# Patient Record
Sex: Female | Born: 1965 | Race: White | Hispanic: No | Marital: Married | Smoking: Never smoker
Health system: Southern US, Community
[De-identification: ages and names within clinical notes are randomized; demographics above are authoritative.]

## PROBLEM LIST (undated history)

## (undated) DIAGNOSIS — K589 Irritable bowel syndrome without diarrhea: Secondary | ICD-10-CM

## (undated) DIAGNOSIS — B029 Zoster without complications: Secondary | ICD-10-CM

## (undated) DIAGNOSIS — K219 Gastro-esophageal reflux disease without esophagitis: Secondary | ICD-10-CM

## (undated) DIAGNOSIS — J45909 Unspecified asthma, uncomplicated: Secondary | ICD-10-CM

## (undated) DIAGNOSIS — K5090 Crohn's disease, unspecified, without complications: Secondary | ICD-10-CM

## (undated) DIAGNOSIS — I4891 Unspecified atrial fibrillation: Secondary | ICD-10-CM

## (undated) DIAGNOSIS — J302 Other seasonal allergic rhinitis: Secondary | ICD-10-CM

## (undated) DIAGNOSIS — F419 Anxiety disorder, unspecified: Secondary | ICD-10-CM

## (undated) DIAGNOSIS — G43909 Migraine, unspecified, not intractable, without status migrainosus: Secondary | ICD-10-CM

## (undated) HISTORY — DX: Unspecified asthma, uncomplicated: J45.909

## (undated) HISTORY — DX: Unspecified atrial fibrillation: I48.91

## (undated) HISTORY — PX: BREAST EXCISIONAL BIOPSY: SUR124

## (undated) HISTORY — PX: WISDOM TOOTH EXTRACTION: SHX21

## (undated) HISTORY — DX: Irritable bowel syndrome, unspecified: K58.9

## (undated) HISTORY — DX: Migraine, unspecified, not intractable, without status migrainosus: G43.909

## (undated) HISTORY — DX: Zoster without complications: B02.9

## (undated) HISTORY — PX: FOOT SURGERY: SHX648

---

## 1993-05-03 DIAGNOSIS — B029 Zoster without complications: Secondary | ICD-10-CM

## 1993-05-03 HISTORY — DX: Zoster without complications: B02.9

## 2005-09-24 ENCOUNTER — Encounter: Admission: RE | Admit: 2005-09-24 | Discharge: 2005-09-24 | Payer: Self-pay | Admitting: Family Medicine

## 2005-11-22 ENCOUNTER — Other Ambulatory Visit: Admission: RE | Admit: 2005-11-22 | Discharge: 2005-11-22 | Payer: Self-pay | Admitting: Obstetrics & Gynecology

## 2006-05-14 ENCOUNTER — Emergency Department (HOSPITAL_COMMUNITY): Admission: EM | Admit: 2006-05-14 | Discharge: 2006-05-14 | Payer: Self-pay | Admitting: Emergency Medicine

## 2008-07-11 ENCOUNTER — Other Ambulatory Visit: Admission: RE | Admit: 2008-07-11 | Discharge: 2008-07-11 | Payer: Self-pay | Admitting: Obstetrics and Gynecology

## 2008-12-04 ENCOUNTER — Encounter: Admission: RE | Admit: 2008-12-04 | Discharge: 2008-12-04 | Payer: Self-pay | Admitting: Obstetrics & Gynecology

## 2009-09-07 ENCOUNTER — Emergency Department (HOSPITAL_COMMUNITY)
Admission: EM | Admit: 2009-09-07 | Discharge: 2009-09-07 | Payer: Self-pay | Source: Home / Self Care | Admitting: Emergency Medicine

## 2009-09-07 ENCOUNTER — Emergency Department (HOSPITAL_COMMUNITY): Admission: EM | Admit: 2009-09-07 | Discharge: 2009-09-07 | Payer: Self-pay | Admitting: Emergency Medicine

## 2009-12-08 ENCOUNTER — Encounter: Admission: RE | Admit: 2009-12-08 | Discharge: 2009-12-08 | Payer: Self-pay | Admitting: Obstetrics & Gynecology

## 2010-07-21 LAB — URINALYSIS, ROUTINE W REFLEX MICROSCOPIC
Glucose, UA: NEGATIVE mg/dL
Ketones, ur: NEGATIVE mg/dL
Leukocytes, UA: NEGATIVE
Nitrite: NEGATIVE
Protein, ur: NEGATIVE mg/dL
pH: 6.5 (ref 5.0–8.0)

## 2010-07-21 LAB — D-DIMER, QUANTITATIVE: D-Dimer, Quant: 0.34 ug/mL-FEU (ref 0.00–0.48)

## 2010-07-21 LAB — COMPREHENSIVE METABOLIC PANEL
Albumin: 3.7 g/dL (ref 3.5–5.2)
Chloride: 108 mEq/L (ref 96–112)
Creatinine, Ser: 0.57 mg/dL (ref 0.4–1.2)
Glucose, Bld: 108 mg/dL — ABNORMAL HIGH (ref 70–99)
Potassium: 3.2 mEq/L — ABNORMAL LOW (ref 3.5–5.1)

## 2010-07-21 LAB — URINE MICROSCOPIC-ADD ON

## 2010-07-21 LAB — PREGNANCY, URINE: Preg Test, Ur: NEGATIVE

## 2010-07-21 LAB — POCT CARDIAC MARKERS
CKMB, poc: 1.4 ng/mL (ref 1.0–8.0)
Troponin i, poc: <0.05 ng/mL (ref 0.00–0.09)

## 2011-02-02 ENCOUNTER — Other Ambulatory Visit: Payer: Self-pay | Admitting: Obstetrics & Gynecology

## 2011-02-02 DIAGNOSIS — Z1231 Encounter for screening mammogram for malignant neoplasm of breast: Secondary | ICD-10-CM

## 2011-02-10 ENCOUNTER — Ambulatory Visit
Admission: RE | Admit: 2011-02-10 | Discharge: 2011-02-10 | Disposition: A | Discharge status: Home / Self Care | Payer: BC Managed Care – PPO | Source: Ambulatory Visit | Attending: Obstetrics & Gynecology | Admitting: Obstetrics & Gynecology

## 2011-02-10 DIAGNOSIS — Z1231 Encounter for screening mammogram for malignant neoplasm of breast: Secondary | ICD-10-CM

## 2012-03-07 ENCOUNTER — Other Ambulatory Visit: Payer: Self-pay | Admitting: Obstetrics & Gynecology

## 2012-03-07 DIAGNOSIS — Z1231 Encounter for screening mammogram for malignant neoplasm of breast: Secondary | ICD-10-CM

## 2012-04-13 ENCOUNTER — Ambulatory Visit
Admission: RE | Admit: 2012-04-13 | Discharge: 2012-04-13 | Disposition: A | Discharge status: Home / Self Care | Payer: BC Managed Care – PPO | Source: Ambulatory Visit | Attending: Obstetrics & Gynecology | Admitting: Obstetrics & Gynecology

## 2012-04-13 DIAGNOSIS — Z1231 Encounter for screening mammogram for malignant neoplasm of breast: Secondary | ICD-10-CM

## 2012-12-18 ENCOUNTER — Encounter: Payer: Self-pay | Admitting: Gynecology

## 2012-12-18 ENCOUNTER — Ambulatory Visit (INDEPENDENT_AMBULATORY_CARE_PROVIDER_SITE_OTHER): Payer: BC Managed Care – PPO | Admitting: Gynecology

## 2012-12-18 ENCOUNTER — Ambulatory Visit: Payer: Self-pay | Admitting: Obstetrics and Gynecology

## 2012-12-18 VITALS — BP 120/72 | HR 66 | Resp 16 | Ht 63.0 in | Wt 181.0 lb

## 2012-12-18 DIAGNOSIS — Z0000 Encounter for general adult medical examination without abnormal findings: Secondary | ICD-10-CM

## 2012-12-18 DIAGNOSIS — Z01419 Encounter for gynecological examination (general) (routine) without abnormal findings: Secondary | ICD-10-CM

## 2012-12-18 DIAGNOSIS — Z124 Encounter for screening for malignant neoplasm of cervix: Secondary | ICD-10-CM

## 2012-12-18 LAB — POCT URINALYSIS DIPSTICK
Leukocytes, UA: NEGATIVE
Urobilinogen, UA: NEGATIVE
pH, UA: 5

## 2012-12-18 NOTE — Patient Instructions (Signed)

## 2012-12-18 NOTE — Progress Notes (Signed)
47 y.o. Married Caucasian female  G2P2 here for annual exam. Pt is currently sexually active.  Pt denies dyspareunia.  Cycles regular.  Irritable bowel associated with stress, pt is Runner, broadcasting/film/video.  Patient's last menstrual period was 12/03/2012.          Sexually active: yes  The current method of family planning is Husband has Vasectomy.    Exercising: no  The patient does not participate in regular exercise at present. Last pap: 11/24/2009- Negative Alcohol: 2-3 drinks/wk Tobacco: no BSE: yes Mammogram: 12/13 BiRADS 1  Urine: Blood 2   No health maintenance topics applied.  Family History  Problem Relation Age of Onset  . Osteoporosis Maternal Grandmother     There are no active problems to display for this patient.   Past Medical History  Diagnosis Date  . IBS (irritable bowel syndrome)   . Migraines     Menstrual  . Asthma     No past surgical history on file.  Allergies: Ciprofloxacin and Penicillins  Current Outpatient Prescriptions  Medication Sig Dispense Refill  . Eletriptan Hydrobromide (RELPAX PO) Take by mouth.      . Loratadine (CLARITIN PO) Take by mouth daily.      Marland Kitchen MAGNESIUM PO Take by mouth.      . Multiple Vitamins-Minerals (MULTIVITAMIN PO) Take by mouth.      . montelukast (SINGULAIR) 10 MG tablet        No current facility-administered medications for this visit.    ROS: A comprehensive review of systems was negative.  Exam:    BP 120/72  Pulse 66  Resp 16  Ht 5\' 3"  (1.6 m)  Wt 181 lb (82.101 kg)  BMI 32.07 kg/m2  LMP 12/03/2012 Weight change: @WEIGHTCHANGE @ Last 3 height recordings:  Ht Readings from Last 3 Encounters:  12/18/12 5\' 3"  (1.6 m)   General appearance: alert, cooperative and appears stated age Head: Normocephalic, without obvious abnormality, atraumatic Neck: no adenopathy, no carotid bruit, no JVD, supple, symmetrical, trachea midline and thyroid not enlarged, symmetric, no tenderness/mass/nodules Lungs: clear to  auscultation bilaterally Breasts: normal appearance, no masses or tenderness Heart: regular rate and rhythm, S1, S2 normal, no murmur, click, rub or gallop Abdomen: soft, non-tender; bowel sounds normal; no masses,  no organomegaly Extremities: extremities normal, atraumatic, no cyanosis or edema Skin: Skin color, texture, turgor normal. No rashes or lesions Lymph nodes: Cervical, supraclavicular, and axillary nodes normal. no inguinal nodes palpated Neurologic: Grossly normal   Pelvic: External genitalia:  no lesions              Urethra: not indicated              Bartholins and Skenes: Bartholin's, Urethra, Skene's normal                 Vagina: normal appearing vagina with normal color and discharge, no lesions              Cervix: normal appearance              Pap taken: yes        Bimanual Exam:  Uterus:  uterus is normal size, shape, consistency and nontender                                      Adnexa:    not indicated  Rectovaginal: Confirms                                      Anus:  normal sphincter tone, no lesions  A: well woman     P: mammogram annual pap smear with HRHPV return annually or prn   An After Visit Summary was printed and given to the patient.

## 2012-12-21 LAB — IPS PAP TEST WITH HPV

## 2013-08-13 ENCOUNTER — Other Ambulatory Visit: Payer: Self-pay

## 2013-08-13 DIAGNOSIS — Z1231 Encounter for screening mammogram for malignant neoplasm of breast: Secondary | ICD-10-CM

## 2013-08-17 ENCOUNTER — Ambulatory Visit
Admission: RE | Admit: 2013-08-17 | Discharge: 2013-08-17 | Disposition: A | Discharge status: Home / Self Care | Payer: BC Managed Care – PPO | Source: Ambulatory Visit

## 2013-08-17 DIAGNOSIS — Z1231 Encounter for screening mammogram for malignant neoplasm of breast: Secondary | ICD-10-CM

## 2014-03-04 ENCOUNTER — Encounter: Payer: Self-pay | Admitting: Gynecology

## 2014-07-25 ENCOUNTER — Telehealth: Payer: Self-pay | Admitting: Nurse Practitioner

## 2014-07-25 ENCOUNTER — Other Ambulatory Visit: Payer: Self-pay | Admitting: Obstetrics and Gynecology

## 2014-07-25 ENCOUNTER — Other Ambulatory Visit: Payer: Self-pay

## 2014-07-25 DIAGNOSIS — N6011 Diffuse cystic mastopathy of right breast: Secondary | ICD-10-CM

## 2014-07-25 DIAGNOSIS — N644 Mastodynia: Secondary | ICD-10-CM

## 2014-07-25 DIAGNOSIS — N6012 Diffuse cystic mastopathy of left breast: Secondary | ICD-10-CM

## 2014-07-25 NOTE — Telephone Encounter (Signed)
Left message to call [facility] [employer] at [phone]. 

## 2014-07-25 NOTE — Telephone Encounter (Signed)
Patient calling about "left breast pain."

## 2014-07-25 NOTE — Telephone Encounter (Signed)
Spoke with patient. Patient states that she has on and off left sided breast pain that has been going on for a couple of weeks. "I had a needle biopsy in my twenties and everything was benign. It seems like it is in the same spot." States the pain is extending under arm. Denies any change in breast size, color, or temperature. Advised patient will need to be seen for evaluation. Patient is agreeable. Appointment scheduled for 3/28 at 11:15am with Lauro FranklinPatricia Rolen-Grubb, FNP. Patient is agreeable to date and time.  Routing to provider for final review. Patient agreeable to disposition. Will close encounter

## 2014-07-29 ENCOUNTER — Encounter: Payer: Self-pay | Admitting: Nurse Practitioner

## 2014-07-29 ENCOUNTER — Ambulatory Visit (INDEPENDENT_AMBULATORY_CARE_PROVIDER_SITE_OTHER): Payer: BC Managed Care – PPO | Admitting: Nurse Practitioner

## 2014-07-29 ENCOUNTER — Other Ambulatory Visit: Payer: Self-pay | Admitting: Nurse Practitioner

## 2014-07-29 VITALS — BP 118/60 | HR 92 | Resp 18 | Ht 63.0 in | Wt 181.2 lb

## 2014-07-29 DIAGNOSIS — N644 Mastodynia: Secondary | ICD-10-CM

## 2014-07-29 DIAGNOSIS — N6012 Diffuse cystic mastopathy of left breast: Secondary | ICD-10-CM

## 2014-07-29 DIAGNOSIS — N6011 Diffuse cystic mastopathy of right breast: Secondary | ICD-10-CM

## 2014-07-29 NOTE — Progress Notes (Signed)
Appointment scheduled for [facility] for left breast diagnostic and ultrasound. Appointment scheduled for 3/30 at 9:10am. Patient is agreeable to date and time.

## 2014-07-29 NOTE — Progress Notes (Signed)
   Subjective:   49 y.o. Married white female G2 P2 .presents for evaluation of left breast pain. Onset of the symptoms was on and off for over 6 months.  Some times fullness and pain is worse at time of menses.  Patient sought evaluation because of breast tenderness.  Contributing factors include: none.   Patient denies history of trauma, bites, or injuries. Last mammogram was April 2015 normal.  Previous evaluation has included breast biopsy (age 49) with a benign breast biopsy.  Thought to be FCB.  No FMH of breast cancer.  The area of fullness or tenderness is at the area of previous biopsy left outer quadrant at 5:00 position.   Review of Systems A comprehensive review of systems was negative.   Objective:   General appearance: alert, cooperative and appears stated age Head: Normocephalic, without obvious abnormality, atraumatic Neck: no adenopathy, no JVD, supple, symmetrical, trachea midline and thyroid not enlarged, symmetric, no tenderness/mass/nodules Back: negative, symmetric, no curvature. ROM normal. No CVA tenderness. Lungs: normal percussion bilaterally Breasts: normal appearance, no masses or tenderness, positive findings: fibrocystic changes and left breast at 5:00 postion there is a scar.  No mass or lesions could be felt.  Only FCB changes. Heart: regular rate and rhythm Abdomen: normal findings: soft, non-tender    Assessment:   ASSESSMENT:Patient is diagnosed with mastalgia, likely due to fibrocystic changes   Plan:   PLAN: The patient has a documented plan to follow with further care of diagnostic Mammo 3D and US on the left or both this week. 2. PLAN: [facility] or sooner, if symptoms worsen 3.  She is also advised to adjust the size of her bra wear as this may also be causing this area of discomfort. No under wires.

## 2014-07-31 ENCOUNTER — Ambulatory Visit
Admission: RE | Admit: 2014-07-31 | Discharge: 2014-07-31 | Disposition: A | Discharge status: Home / Self Care | Payer: BC Managed Care – PPO | Source: Ambulatory Visit | Attending: Nurse Practitioner | Admitting: Nurse Practitioner

## 2014-07-31 DIAGNOSIS — N6012 Diffuse cystic mastopathy of left breast: Secondary | ICD-10-CM

## 2014-07-31 DIAGNOSIS — N6011 Diffuse cystic mastopathy of right breast: Secondary | ICD-10-CM

## 2014-07-31 DIAGNOSIS — N644 Mastodynia: Secondary | ICD-10-CM

## 2014-07-31 NOTE — Progress Notes (Signed)
Encounter reviewed by Dr. Candus Braud Silva.  

## 2014-10-23 ENCOUNTER — Ambulatory Visit (INDEPENDENT_AMBULATORY_CARE_PROVIDER_SITE_OTHER): Payer: BC Managed Care – PPO | Admitting: Nurse Practitioner

## 2014-10-23 ENCOUNTER — Encounter: Payer: Self-pay | Admitting: Nurse Practitioner

## 2014-10-23 VITALS — BP 134/80 | HR 72 | Ht 63.0 in | Wt 190.0 lb

## 2014-10-23 DIAGNOSIS — Z01419 Encounter for gynecological examination (general) (routine) without abnormal findings: Secondary | ICD-10-CM | POA: Diagnosis not present

## 2014-10-23 NOTE — Patient Instructions (Addendum)

## 2014-10-23 NOTE — Progress Notes (Signed)
49 y.o. G56P2002 Married  Caucasian Fe here for annual exam.  For this past year will occasionally skip a year.  Then in March menses was regular, then in April spotting for less than a day.  No menses in May and now a regular cycle with moderate flow.  Some vaso symptoms..  Patient's last menstrual period was 10/22/2014.          Sexually active: Yes.    The current method of family planning is Husband has had vasectomy.    Exercising: No.  The patient does not participate in regular exercise at present. Smoker:  no  Health Maintenance: Pap:  12/18/12 wnl neg HR HPV MMG:  07/31/14 simple cyst in left breast benign, due for bilateral now and will schedule Colonoscopy:  2012 benign polyps f/u due in 2017 TDaP:  12/16/12 Labs: PCP   reports that she has never smoked. She does not have any smokeless tobacco history on file. She reports that she drinks about 0.6 - 1.2 oz of alcohol per week. She reports that she does not use illicit drugs.  Past Medical History  Diagnosis Date  . IBS (irritable bowel syndrome)   . Migraines     Menstrual  . Asthma     History reviewed. No pertinent past surgical history.  Current Outpatient Prescriptions  Medication Sig Dispense Refill  . aspirin 81 MG tablet Take 81 mg by mouth daily.    . diphenoxylate-atropine (LOMOTIL) 2.5-0.025 MG per tablet Take by mouth 4 (four) times daily as needed for diarrhea or loose stools.    . Loratadine (CLARITIN PO) Take by mouth daily.    Marland Kitchen MAGNESIUM PO Take by mouth.    . montelukast (SINGULAIR) 10 MG tablet     . Multiple Vitamins-Minerals (MULTIVITAMIN PO) Take by mouth.    . Eletriptan Hydrobromide (RELPAX PO) Take by mouth as needed.      No current facility-administered medications for this visit.    Family History  Problem Relation Age of Onset  . Osteoporosis Maternal Grandmother     ROS:  Pertinent items are noted in HPI.  Otherwise, a comprehensive ROS was negative.  Exam:   BP 134/80 mmHg  Pulse  72  Ht 5\' 3"  (1.6 m)  Wt 190 lb (86.183 kg)  BMI 33.67 kg/m2  LMP 10/22/2014 Height: 5\' 3"  (160 cm) Ht Readings from Last 3 Encounters:  10/23/14 5\' 3"  (1.6 m)  07/29/14 5\' 3"  (1.6 m)  12/18/12 5\' 3"  (1.6 m)    General appearance: alert, cooperative and appears stated age Head: Normocephalic, without obvious abnormality, atraumatic Neck: no adenopathy, supple, symmetrical, trachea midline and thyroid normal to inspection and palpation Lungs: clear to auscultation bilaterally Breasts: normal appearance, no masses or tenderness Heart: regular rate and rhythm Abdomen: soft, non-tender; no masses,  no organomegaly Extremities: extremities normal, atraumatic, no cyanosis or edema Skin: Skin color, texture, turgor normal. No rashes or lesions Lymph nodes: Cervical, supraclavicular, and axillary nodes normal. No abnormal inguinal nodes palpated Neurologic: Grossly normal   Pelvic: External genitalia:  no lesions              Urethra:  normal appearing urethra with no masses, tenderness or lesions              Bartholin's and Skene's: normal                 Vagina: normal appearing vagina with normal color and discharge, no lesions  Cervix: anteverted              Pap taken: No. Bimanual Exam:  Uterus:  normal size, contour, position, consistency, mobility, non-tender              Adnexa: no mass, fullness, tenderness               Rectovaginal: Confirms               Anus:  normal sphincter tone, no lesions  Chaperone present: No  A:  Well Woman with normal exam  Husband with vasectomy  Perimenopausal C/W irregular menses  P:   Reviewed health and wellness pertinent to exam  Pap smear as above  Mammogram is due now and will schedule now that school is out  Counseled on breast self exam, mammography screening, adequate intake of calcium and vitamin D, diet and exercise return annually or prn  An After Visit Summary was printed and given to the patient.

## 2014-10-26 NOTE — Progress Notes (Signed)
Encounter reviewed by Dr. Brook Silva.  

## 2014-10-30 ENCOUNTER — Other Ambulatory Visit: Payer: Self-pay

## 2014-10-30 DIAGNOSIS — Z1231 Encounter for screening mammogram for malignant neoplasm of breast: Secondary | ICD-10-CM

## 2014-11-01 ENCOUNTER — Ambulatory Visit
Admission: RE | Admit: 2014-11-01 | Discharge: 2014-11-01 | Disposition: A | Discharge status: Home / Self Care | Payer: BC Managed Care – PPO | Source: Ambulatory Visit

## 2014-11-01 DIAGNOSIS — Z1231 Encounter for screening mammogram for malignant neoplasm of breast: Secondary | ICD-10-CM

## 2015-05-06 ENCOUNTER — Other Ambulatory Visit: Payer: Self-pay | Admitting: Family Medicine

## 2015-05-06 DIAGNOSIS — R1013 Epigastric pain: Secondary | ICD-10-CM

## 2015-05-13 ENCOUNTER — Other Ambulatory Visit: Payer: BC Managed Care – PPO

## 2015-05-19 ENCOUNTER — Ambulatory Visit
Admission: RE | Admit: 2015-05-19 | Discharge: 2015-05-19 | Disposition: A | Discharge status: Home / Self Care | Payer: BC Managed Care – PPO | Source: Ambulatory Visit | Attending: Family Medicine | Admitting: Family Medicine

## 2015-05-19 DIAGNOSIS — R1013 Epigastric pain: Secondary | ICD-10-CM

## 2015-06-05 ENCOUNTER — Telehealth: Payer: Self-pay | Admitting: Obstetrics and Gynecology

## 2015-06-05 NOTE — Telephone Encounter (Signed)
Patient says she hasn't had her cycle since 03/06/2016 and was told to let Dr. Edward Jolly know. 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

## 2015-06-05 NOTE — Telephone Encounter (Signed)
Spoke with patient. She reports she has not had a menses since November of 2016. Reports since May 2016 she was having her cycles every 2 months until November. She did have light spotting 2 weeks ago. "It was just a little spotting. I thought I was going to have my cycle, but it never came." Advised I will speak with Ria Comment, FNP regarding her cycles and further recommendations and return call. She is agreeable.

## 2015-06-05 NOTE — Telephone Encounter (Signed)
The patient was last seen in office for her aex on 10/23/2014 with Ria Comment, FNP. At that time she had been experiencing irregular cycles. She had a cycle in March 2016, spotting in April 2016, no menses in May 2016, and normal cycle in June 2016. The patient is calling to report she has not had a cycle since 03/07/2015.  Left message to call Aram Domzalski at [phone]. How was her menses from June 2016-November 2016?

## 2015-06-06 NOTE — Telephone Encounter (Signed)
Left message to call Jermanie Minshall at [phone]. 

## 2015-06-06 NOTE — Telephone Encounter (Signed)
We consider any spotting even light, as a cycle.  She now needs to keep a record and if no further bleeding in 3 months to call back.

## 2015-06-09 NOTE — Telephone Encounter (Signed)
Patient left message on our voicemail during lunch hours returning your call. [phone]

## 2015-06-09 NOTE — Telephone Encounter (Signed)
Left message to call Danasia Baker at [phone]. 

## 2015-06-10 NOTE — Telephone Encounter (Signed)
Spoke with patient. Advised of message as seen below form Ria Comment, FNP. She is agreeable and verbalizes understanding.  Routing to provider for final review. Patient agreeable to disposition. Will close encounter.

## 2015-07-09 ENCOUNTER — Telehealth: Payer: Self-pay | Admitting: Obstetrics and Gynecology

## 2015-07-09 NOTE — Telephone Encounter (Signed)
Spoke with patient. Patient states she had light bleeding in January. Prior to that LMP was in Nov 2016. Reports she started bleeding 3 weeks ago. Bleeding was constant for 3 weeks and intermittently heavy with clotting. Bleeding stopped for 24 hours then began again today. Reports she is passing increased clots with intermittent heavy bleeding. Has been fatigued over the last month. Denies SOB, dizziness, or light headedness. Advised she will need to be seen in the office for further evaluation. She is agreeable. 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 Appointment scheduled for 07/11/2015 at 11:15 am with Ria CommentPatricia Grubb, FNP. She is agreeable to date and time. Advised if bleeding increases to having to change pad/tampon every 1 for 2 hours she will need to be seen sooner for evaluation. She is agreeable.  Routing to provider for final review. Patient agreeable to disposition. Will close encounter.

## 2015-07-09 NOTE — Telephone Encounter (Signed)
Patient's cycle has been going on for a month now wants to talk with a nurse. Best # to reach: [phone]

## 2015-07-11 ENCOUNTER — Ambulatory Visit (INDEPENDENT_AMBULATORY_CARE_PROVIDER_SITE_OTHER): Payer: BC Managed Care – PPO | Admitting: Nurse Practitioner

## 2015-07-11 ENCOUNTER — Encounter: Payer: Self-pay | Admitting: Nurse Practitioner

## 2015-07-11 VITALS — BP 120/78 | HR 72 | Ht 63.0 in | Wt 185.0 lb

## 2015-07-11 DIAGNOSIS — N939 Abnormal uterine and vaginal bleeding, unspecified: Secondary | ICD-10-CM

## 2015-07-11 LAB — TSH: TSH: 1.29 mIU/L

## 2015-07-11 MED ORDER — MEDROXYPROGESTERONE ACETATE 10 MG PO TABS: 10.0000 mg | ORAL_TABLET | Freq: Every day | ORAL | Status: DC

## 2015-07-11 NOTE — Progress Notes (Signed)
Subjective:     Patient ID: Nicole Hays, female   DOB: Sep 19, 1965, [age] y.o.   MRN: [id number]  HPI  This 50 yo WM female G2P2 here for evaluation of AUB.  LMP 06/13/15 till present.  PMP was in January with light bleeding for 2 days that occurred after SA.Marland Kitchen.  Previous to that one was November 2016 normal flow lasted 5-6 days.  Some days are heavier with moderate size clotting.  No cramps. Some fatigue, dizzy, and lethargy.  No palpitations.     Review of Systems  Constitutional: Positive for fatigue.  Respiratory: Negative.   Cardiovascular: Negative for palpitations.  Gastrointestinal: Negative.   Genitourinary: Positive for vaginal bleeding and menstrual problem.  Musculoskeletal: Negative.   Neurological: Positive for dizziness.  Hematological: Negative.   Psychiatric/Behavioral: Negative.        Objective:   Physical Exam  Constitutional: She is oriented to person, place, and time. She appears well-developed and well-nourished.  Cardiovascular: Normal rate.   Pulmonary/Chest: Effort normal.  Abdominal: Soft. She exhibits no distension and no mass. There is no tenderness. There is no rebound and no guarding.  Genitourinary:  Moderate vaginal bleeding, no evidence of a polyp.  No enlargement of the uterus.  No adnexal mass.  Musculoskeletal: Normal range of motion.  Neurological: She is alert and oriented to person, place, and time.  Psychiatric: She has a normal mood and affect. Her behavior is normal. Judgment and thought content normal.       Assessment:    AUB Perimenopausal     Plan:     Will have her to take Provera 10 mg X 10 days - initially to stop this cycle and expect a withdrawal bleed.  Then should resume to a normal menses.  She is aware that if she has amenorrhea for 3 months to call or if AUB again.  Will then do further evaluation.

## 2015-07-11 NOTE — Patient Instructions (Addendum)

## 2015-07-12 LAB — CBC WITH DIFFERENTIAL/PLATELET
Basophils Absolute: 0 10*3/uL (ref 0.0–0.1)
Basophils Relative: 0 % (ref 0–1)
EOS PCT: 1 % (ref 0–5)
Eosinophils Absolute: 0.1 10*3/uL (ref 0.0–0.7)
HEMATOCRIT: 37.5 % (ref 36.0–46.0)
Hemoglobin: 12.3 g/dL (ref 12.0–15.0)
LYMPHS PCT: 24 % (ref 12–46)
Lymphs Abs: 2.8 10*3/uL (ref 0.7–4.0)
MCH: 28 pg (ref 26.0–34.0)
MCHC: 32.8 g/dL (ref 30.0–36.0)
MCV: 85.4 fL (ref 78.0–100.0)
MONO ABS: 0.6 10*3/uL (ref 0.1–1.0)
MONOS PCT: 5 % (ref 3–12)
MPV: 12.2 fL (ref 8.6–12.4)
Neutro Abs: 8.3 10*3/uL — ABNORMAL HIGH (ref 1.7–7.7)
Neutrophils Relative %: 70 % (ref 43–77)
Platelets: 319 10*3/uL (ref 150–400)
RBC: 4.39 MIL/uL (ref 3.87–5.11)
RDW: 14.2 % (ref 11.5–15.5)
WBC: 11.8 10*3/uL — ABNORMAL HIGH (ref 4.0–10.5)

## 2015-07-13 NOTE — Progress Notes (Signed)
Encounter reviewed by Dr. Janean SarkBrook Amundson C. Silva. BC method - vasectomy on chart review.

## 2015-07-15 ENCOUNTER — Telehealth: Payer: Self-pay | Admitting: *Deleted

## 2015-07-15 DIAGNOSIS — N939 Abnormal uterine and vaginal bleeding, unspecified: Secondary | ICD-10-CM

## 2015-07-15 NOTE — Telephone Encounter (Signed)
-----   Message from Ria CommentPatricia Grubb, FNP sent at 07/14/2015  8:17 AM EDT ----- Please let pt know that CBC shows a normal HGB at 12.3 but her WBC is slightly elevated - has she been sick with URI?  The TSH is normal.

## 2015-07-15 NOTE — Telephone Encounter (Signed)
I have attempted to contact this patient by phone with the following results: left message to return call to [place] at [phone] answering machine (home/mobile [facility]).  No personal information given.  [phone] (Home) *Preferred*

## 2015-07-24 MED ORDER — NORETHINDRONE ACETATE 5 MG PO TABS: 5.0000 mg | ORAL_TABLET | Freq: Two times a day (BID) | ORAL | Status: DC

## 2015-07-24 NOTE — Telephone Encounter (Signed)
Routing to Dr.Miller for review and advise. 

## 2015-07-24 NOTE — Telephone Encounter (Signed)
Spoke with patient. Advised of message as seen below from Dr.Miller. She is agreeable and verbalizes understanding. Rx for Aygestin 5 mg bid #30 0RF sent to pharmacy on file. Aware she may need an EMB tomorrow depending on evaluation with Dr.Miller and her recommendations. Advised to take 800 mg of Ibuprofen/Motrin 1 hour prior to the appointment in case EMB is needed. She is agreeable and verbalizes understanding. Future order placed for CBC.  Routing to provider for final review. Patient agreeable to disposition. Will close encounter.

## 2015-07-24 NOTE — Telephone Encounter (Signed)
Spoke with patient. Advised of message and results as seen below from Ria CommentPatricia Grubb, FNP. Reports she has not been ill recently. "I have actually been very health." Advised I will let Ria CommentPatricia Grubb, FNP know. Advised it is not uncommon for the WBC to fluctuate in patients. Advised she will need a follow up lab to recheck CBC. Advised I will speak with the provider about recommended time frame. She is agreeable. States she started Provera on 07/11/2015. LMP was from 06/13/2015-07/11/2015. States with taking Provera her cycle stopped for 4 days and then restarted on 07/19/2015. Bleeding is heavy. States she is having to change an overnight pad every hour to hour and a half due to the pad being "full". Denies any feeling of fatigue, dizziness, or light headedness. Advised withdrawal bleed that is heavier than normal is expected after completing the Provera. Due to the amount of bleeding she is experiencing I will speak with the covering provider regarding follow up. She is agreeable.

## 2015-07-24 NOTE — Telephone Encounter (Signed)
Change to Aygestin 5mg  bid.  I think she needs repeat appt and possibly endometrial biopsy.  This sounds like a lot of bleeding.  We can repeat the CBC when she is here.

## 2015-07-25 ENCOUNTER — Encounter: Payer: Self-pay | Admitting: Obstetrics & Gynecology

## 2015-07-25 ENCOUNTER — Ambulatory Visit (INDEPENDENT_AMBULATORY_CARE_PROVIDER_SITE_OTHER): Payer: BC Managed Care – PPO | Admitting: Obstetrics & Gynecology

## 2015-07-25 VITALS — BP 120/64 | HR 78 | Resp 16 | Ht 63.0 in | Wt 186.0 lb

## 2015-07-25 DIAGNOSIS — Z124 Encounter for screening for malignant neoplasm of cervix: Secondary | ICD-10-CM

## 2015-07-25 DIAGNOSIS — N921 Excessive and frequent menstruation with irregular cycle: Secondary | ICD-10-CM | POA: Diagnosis not present

## 2015-07-25 DIAGNOSIS — D500 Iron deficiency anemia secondary to blood loss (chronic): Secondary | ICD-10-CM | POA: Diagnosis not present

## 2015-07-25 LAB — POCT URINE PREGNANCY: PREG TEST UR: NEGATIVE

## 2015-07-25 LAB — FOLLICLE STIMULATING HORMONE: FSH: 5.2 m[IU]/mL

## 2015-07-25 NOTE — Patient Instructions (Signed)
Increase the norethindrone 5mg  to two tablet (10mg ) twice daily.  Continue this until you have no bleeding for 48 hours.  Then decrease back to 5mg  twice daily for two more days.  Then decrease to 5mg  daily.  If you start bleeding, go back to what you were taking when you stopped bleeding.

## 2015-07-25 NOTE — Progress Notes (Signed)
GYNECOLOGY  VISIT   HPI: 50 y.o. G20P2002 Married Caucasian female here with menorrhagia.  Pt has been bleeding for about five weeks except for a few days when she was on Provera.  She reports her cycles have definitely changed in the past year.  She skips on to three months of bleeding at a time but, then the subsequent bleeding is quite heavy and can last longer then a typical period.  This cycle started 06/13/15.  She saw Shirlyn Goltz on 07/11/15 and was started on Provera.  She reports the bleeding stopped for 4 days with the provera but started back before the provera was completed.  Then when it was done, the bleeding got even heavier.  Yesterday, she was filling up a pad every 90 minutes.  She was started on aygestin  bid yesterday and she reports her bleeding improved significantly overnight and today, except for a two hour period where she had heavy bleeding that soaked a pad and leaked around in the front and back.  However, it is better now.  Denies light headedness, SOB, palpitations, weakness.  She just wants the bleeding to stop at this point.  Last pap 8/14: neg with neg HR HPV.  She's had no evaluation for this bleeding, to date.  GYNECOLOGIC HISTORY: Patient's last menstrual period was 07/15/2015. Contraception: spouse with vasectomy  There are no active problems to display for this patient.   Past Medical History  Diagnosis Date  . IBS (irritable bowel syndrome)   . Migraines     Menstrual  . Asthma     History reviewed. No pertinent past surgical history.  MEDS:  Reviewed in EPIC and UTD  ALLERGIES: Ciprofloxacin and Penicillins  Family History  Problem Relation Age of Onset  . Osteoporosis Maternal Grandmother     SH: married, non smoker  Review of Systems  Genitourinary:       Heavy bleeding  All other systems reviewed and are negative.   PHYSICAL EXAMINATION:    BP 120/64 mmHg  Pulse 78  Resp 16  Ht  (1.6 m)  Wt 186 lb (84.369 kg)  BMI 32.96  kg/m2  LMP 07/15/2015    General appearance: alert, cooperative and appears stated age Neck: no adenopathy, supple, symmetrical, trachea midline and thyroid normal to inspection and palpation CV:  Regular rate and rhythm Lungs:  clear to auscultation, no wheezes, rales or rhonchi, symmetric air entry Abdomen: soft, non-tender; bowel sounds normal; no masses,  no organomegaly  Pelvic: External genitalia:  no lesions              Urethra:  normal appearing urethra with no masses, tenderness or lesions              Bartholins and Skenes: normal                 Vagina: normal appearing vagina with normal color and discharge, no lesions, small amount of blood present at os but not significant              Cervix: no lesions    Pap was obtained              Bimanual Exam:  Uterus:  normal size, contour, position, consistency, mobility, non-tender              Adnexa: no mass, fullness, tenderness              Anus:  normal sphincter tone, no lesions  Endometrial biopsy  recommended.  Discussed with patient.  Verbal and written consent obtained.   Procedure:  Speculum placed.  Cervix visualized and cleansed with betadine prep.  A single toothed tenaculum was applied to the anterior lip of the cervix.  Endometrial pipelle was advanced through the cervix into the endometrial cavity without difficulty.  Pipelle passed to 7cm.  Suction applied and pipelle removed with good tissue sample obtained.  Tenculum removed.  No bleeding noted.  Patient tolerated procedure well.  Chaperone was present for exam.  Assessment: Menorrhagia with cycle lasting almost daily cine 06/13/15 Perimenopausal Mild anemia with hb 11.4 today  Plan: Increase norithindrone to 10mg  bid until no bleeding for 48 hours.  Then decrease to 5mg  BID for another 48 hours.  Then decrease to 5mg  daily. [facility] Endometrial biopsy pending.  Results and recommendations will be called to pt.

## 2015-07-25 NOTE — Progress Notes (Signed)
Scheduled patient while in the office for PUS on 07/29/2015 at 4 pm with consult at 4:30 pm with Dr.Miller. She is agreeable to date and time.

## 2015-07-27 DIAGNOSIS — N921 Excessive and frequent menstruation with irregular cycle: Secondary | ICD-10-CM | POA: Insufficient documentation

## 2015-07-27 DIAGNOSIS — D500 Iron deficiency anemia secondary to blood loss (chronic): Secondary | ICD-10-CM | POA: Insufficient documentation

## 2015-07-28 ENCOUNTER — Telehealth: Payer: Self-pay | Admitting: Obstetrics & Gynecology

## 2015-07-28 LAB — HEMOGLOBIN, FINGERSTICK: HEMOGLOBIN, FINGERSTICK: 11.4 g/dL — AB (ref 12.0–16.0)

## 2015-07-28 NOTE — Telephone Encounter (Signed)
Called patient to review benefits for a recommended procedure. Left Voicemail requesting a call back. °

## 2015-07-28 NOTE — Telephone Encounter (Signed)
Gave patient benefit for ultrasound, patient agreeable, encounter closed.

## 2015-07-29 ENCOUNTER — Ambulatory Visit (INDEPENDENT_AMBULATORY_CARE_PROVIDER_SITE_OTHER): Payer: BC Managed Care – PPO

## 2015-07-29 ENCOUNTER — Ambulatory Visit (INDEPENDENT_AMBULATORY_CARE_PROVIDER_SITE_OTHER): Payer: BC Managed Care – PPO | Admitting: Obstetrics & Gynecology

## 2015-07-29 VITALS — BP 132/70 | HR 76 | Resp 16 | Ht 63.0 in | Wt 185.4 lb

## 2015-07-29 DIAGNOSIS — N840 Polyp of corpus uteri: Secondary | ICD-10-CM | POA: Diagnosis not present

## 2015-07-29 DIAGNOSIS — N921 Excessive and frequent menstruation with irregular cycle: Secondary | ICD-10-CM

## 2015-07-29 DIAGNOSIS — D500 Iron deficiency anemia secondary to blood loss (chronic): Secondary | ICD-10-CM | POA: Diagnosis not present

## 2015-07-29 MED ORDER — DIAZEPAM 5 MG PO TABS: 5.0000 mg | ORAL_TABLET | Freq: Four times a day (QID) | ORAL | Status: DC | PRN

## 2015-07-29 MED ORDER — OXYCODONE-ACETAMINOPHEN 5-325 MG PO TABS: 2.0000 | ORAL_TABLET | ORAL | Status: DC | PRN

## 2015-07-29 MED ORDER — NORETHINDRONE ACETATE 5 MG PO TABS: 5.0000 mg | ORAL_TABLET | Freq: Two times a day (BID) | ORAL | Status: DC

## 2015-07-29 NOTE — Progress Notes (Signed)
GYNECOLOGY  VISIT   HPI: 50 y.o. [id number] Married Caucasian female here for additional evaluation of bleeding that has been ongoing since mid January.  The Aygestin did significantly decrease her bleeding.  She lowered the dosage earlier this week but started spotting again.  It is not significant.  FSH was low and endometrial biopsy was negative but did show an endometrial polyp.    Pt is here for PUS and for additional recommendations.  She denies dizziness, SOB, palpitations or fatigue.  She is just glad the bleeding is improved.  GYNECOLOGIC HISTORY: Patient's last menstrual period was 07/15/2015. Contraception: vasectomy Last Pap:  07/28/14, normal.  Patient Active Problem List   Diagnosis Date Noted  . Menorrhagia with irregular cycle 07/27/2015  . Anemia due to chronic blood loss 07/27/2015    Past Medical History  Diagnosis Date  . IBS (irritable bowel syndrome)   . Migraines     Menstrual  . Asthma     No past surgical history on file.  MEDS:  Reviewed in EPIC and UTD  ALLERGIES: Ciprofloxacin and Penicillins  Family History  Problem Relation Age of Onset  . Osteoporosis Maternal Grandmother     SH: married, non smoker  Review of Systems  All other systems reviewed and are negative.   PHYSICAL EXAMINATION:    BP 132/70 mmHg  Pulse 76  Resp 16  Ht 5\' 3"  (1.6 m)  Wt 185 lb 6.4 oz (84.097 kg)  BMI 32.85 kg/m2  LMP 07/15/2015    General appearance: alert, cooperative and appears stated age Neck: no adenopathy, supple, symmetrical, trachea midline and thyroid normal to inspection and palpation CV:  Regular rate and rhythm Lungs:  clear to auscultation, no wheezes, rales or rhonchi, symmetric air entry Abdomen: soft, non-tender; bowel sounds normal; no masses,  no organomegaly  Pelvic: Repeat pelvic exam was not performed.  PUS results: Uterus: 8.7 x 5.6 x 5.1cm Endometrium: 6mm with probable polyp noted on 3D imaging.  Endometrium is irregular. Left  ovary 2.8 x 1.5 x 1.5cm Right ovary 3.0 x 2.0 x 2.2cm with 1.8cm follicle Cul de sac:  Discussion:  Considering the amount of bleeding she's been having and the endometrial thickness/irregularity as well as the polyp feel treatment is warranted.  Continued oral progesterone, oral OCPs, polyp resection and progesterone IUD placement, polyp resection and endometrial ablation all reviewed.  Side effects, risks of each option reviewed.  Pt would like most definitive treatment.  She is not interested in hysterectomy.  Procedure discussed with patient.  Recovery and pain management discussed.  Risks discussed including but not limited to bleeding, rare risk of transfusion, infection, 1% risk of uterine perforation with risks of fluid deficit causing cardiac arrythmia, cerebral swelling and/or need to stop procedure early.  Fluid emboli and rare risk of death discussed.  DVT/PE, rare risk of risk of bowel/bladder/ureteral/vascular injury.  Also, importance of birth control discussed.  Risks of post operative ablation syndrome reviewed as well.  Patient aware if pathology with polyp removal is abnormal she may need additional treatment.  All questions answered.  She wishes to consider dates today and sat with Billie RuddySally Yeakley, RN, before leaving office.  Assessment: Menorrhagia with irregular cycle Endometrial polyp Irregular endometrium Anemia  Plan: Hysteroscopy with polyp resection, D&C, followed by endometrial ablation planned. Pt knows to stop ASA products five days before procedure.  Orders placed.   Rx for Valium and Percocet given with instructions for use post operatively.   ~30 minutes spent with  patient >50% of time was in face to face discussion of above.

## 2015-07-31 ENCOUNTER — Telehealth: Payer: Self-pay | Admitting: Obstetrics & Gynecology

## 2015-07-31 ENCOUNTER — Other Ambulatory Visit: Payer: Self-pay | Admitting: Obstetrics & Gynecology

## 2015-07-31 ENCOUNTER — Encounter: Payer: Self-pay | Admitting: Obstetrics & Gynecology

## 2015-07-31 DIAGNOSIS — N840 Polyp of corpus uteri: Secondary | ICD-10-CM | POA: Insufficient documentation

## 2015-07-31 LAB — IPS PAP TEST WITH REFLEX TO HPV

## 2015-07-31 NOTE — Telephone Encounter (Signed)
Patient is scheduled for surgery 08/11/15 with Dr. Hyacinth MeekerMiller but has not received a call in regards to what her insurance will cover/patietn responsibilites. Best # to reach: [phone]

## 2015-07-31 NOTE — Telephone Encounter (Signed)
Call patient to discuss benefits for a procedure. Left Voicemail requesting a call. °

## 2015-07-31 NOTE — Telephone Encounter (Signed)
Patient is returning a call to Suzy. °

## 2015-08-01 ENCOUNTER — Other Ambulatory Visit: Payer: Self-pay | Admitting: *Deleted

## 2015-08-01 ENCOUNTER — Encounter (HOSPITAL_COMMUNITY): Payer: Self-pay | Admitting: *Deleted

## 2015-08-01 NOTE — Telephone Encounter (Signed)
Nicole Hays from [facility] calling requesting a new prescription of Aygestin be sent in. Patient said the directions have increased for patient to take it four times daily, and insurance won't pay for it. 07/29/15 #30/0 rfs was sent to [facility] with patient to take bid. Please advise new rx with dosage and directions if this is correct so patient can get rx, she is almost out of pills.

## 2015-08-02 MED ORDER — NORETHINDRONE ACETATE 5 MG PO TABS: 10.0000 mg | ORAL_TABLET | Freq: Two times a day (BID) | ORAL | Status: DC

## 2015-08-04 NOTE — Telephone Encounter (Signed)
Returned call to patient to convey benefits. Left message on voicemail requesting a call back

## 2015-08-05 NOTE — Telephone Encounter (Signed)
Spoke with pt regarding benefit for surgery. Patient understood and agreeable. Patient has already been scheduled on 08/11/15. Patient advised she will call back on 08/08/15 to provide surgery deposit. Patient is aware this is professional benefit only. Patient aware will be contacted by hospital for separate benefits.

## 2015-08-08 ENCOUNTER — Telehealth: Payer: Self-pay | Admitting: *Deleted

## 2015-08-08 NOTE — Telephone Encounter (Signed)
Call to patient. Advised of recommendations from Dr Hyacinth MeekerMiller for pain medication through the weekend. Patient has had this for a while without it bothering her and planned to take care of it next week on spring break. Advised to notify anesthesia at hospital on Monday. Encounter closed.

## 2015-08-08 NOTE — Telephone Encounter (Signed)
Patient returns calls. Reviewed surgery instructions for procedure on Monday. Patient has been off her baby ASA for 6 days.  Has a cracked tooth on top right that has not ben an issue but started hurting today and Motrin is the only thing that helps with pain. Patient is asking what to take this weekend for pain? Can she take the prescription given for post op pain? Advised will review with Dr Hyacinth MeekerMiller and call her back.

## 2015-08-08 NOTE — Telephone Encounter (Signed)
Call to patient, left message to call back. 

## 2015-08-08 NOTE — Telephone Encounter (Signed)
Yes she can take the post op rx for pain.

## 2015-08-11 ENCOUNTER — Encounter (HOSPITAL_COMMUNITY)
Admission: RE | Disposition: A | Discharge status: Home / Self Care | Payer: Self-pay | Source: Ambulatory Visit | Attending: Obstetrics & Gynecology

## 2015-08-11 ENCOUNTER — Ambulatory Visit (HOSPITAL_COMMUNITY)
Admission: RE | Admit: 2015-08-11 | Discharge: 2015-08-11 | Disposition: A | Discharge status: Home / Self Care | Payer: BC Managed Care – PPO | Source: Ambulatory Visit | Attending: Obstetrics & Gynecology | Admitting: Obstetrics & Gynecology

## 2015-08-11 ENCOUNTER — Ambulatory Visit (HOSPITAL_COMMUNITY): Payer: BC Managed Care – PPO | Admitting: Certified Registered Nurse Anesthetist

## 2015-08-11 ENCOUNTER — Encounter (HOSPITAL_COMMUNITY): Payer: Self-pay | Admitting: Emergency Medicine

## 2015-08-11 DIAGNOSIS — N920 Excessive and frequent menstruation with regular cycle: Secondary | ICD-10-CM | POA: Insufficient documentation

## 2015-08-11 DIAGNOSIS — Z7982 Long term (current) use of aspirin: Secondary | ICD-10-CM | POA: Diagnosis not present

## 2015-08-11 DIAGNOSIS — K219 Gastro-esophageal reflux disease without esophagitis: Secondary | ICD-10-CM | POA: Insufficient documentation

## 2015-08-11 DIAGNOSIS — N921 Excessive and frequent menstruation with irregular cycle: Secondary | ICD-10-CM | POA: Diagnosis not present

## 2015-08-11 DIAGNOSIS — K589 Irritable bowel syndrome without diarrhea: Secondary | ICD-10-CM | POA: Insufficient documentation

## 2015-08-11 DIAGNOSIS — Z881 Allergy status to other antibiotic agents status: Secondary | ICD-10-CM | POA: Insufficient documentation

## 2015-08-11 DIAGNOSIS — D649 Anemia, unspecified: Secondary | ICD-10-CM | POA: Diagnosis not present

## 2015-08-11 DIAGNOSIS — Z880 Allergy status to penicillin: Secondary | ICD-10-CM | POA: Insufficient documentation

## 2015-08-11 DIAGNOSIS — Z79899 Other long term (current) drug therapy: Secondary | ICD-10-CM | POA: Insufficient documentation

## 2015-08-11 DIAGNOSIS — J45909 Unspecified asthma, uncomplicated: Secondary | ICD-10-CM | POA: Diagnosis not present

## 2015-08-11 DIAGNOSIS — N840 Polyp of corpus uteri: Secondary | ICD-10-CM | POA: Insufficient documentation

## 2015-08-11 HISTORY — DX: Anxiety disorder, unspecified: F41.9

## 2015-08-11 HISTORY — PX: DILATATION & CURETTAGE/HYSTEROSCOPY WITH MYOSURE: SHX6511

## 2015-08-11 HISTORY — PX: NOVASURE ABLATION: SHX5394

## 2015-08-11 HISTORY — DX: Other seasonal allergic rhinitis: J30.2

## 2015-08-11 HISTORY — DX: Gastro-esophageal reflux disease without esophagitis: K21.9

## 2015-08-11 LAB — CBC
HEMATOCRIT: 34.7 % — AB (ref 36.0–46.0)
Hemoglobin: 11.8 g/dL — ABNORMAL LOW (ref 12.0–15.0)
MCH: 28.6 pg (ref 26.0–34.0)
MCHC: 34 g/dL (ref 30.0–36.0)
MCV: 84 fL (ref 78.0–100.0)
Platelets: 378 10*3/uL (ref 150–400)
RBC: 4.13 MIL/uL (ref 3.87–5.11)
RDW: 14.9 % (ref 11.5–15.5)
WBC: 9.4 10*3/uL (ref 4.0–10.5)

## 2015-08-11 LAB — PREGNANCY, URINE: Preg Test, Ur: NEGATIVE

## 2015-08-11 SURGERY — DILATATION & CURETTAGE/HYSTEROSCOPY WITH MYOSURE
Anesthesia: General | Site: Vagina

## 2015-08-11 MED ORDER — KETOROLAC TROMETHAMINE 30 MG/ML IJ SOLN
INTRAMUSCULAR | Status: AC
  Filled 2015-08-11: qty 1

## 2015-08-11 MED ORDER — LIDOCAINE-EPINEPHRINE 1 %-1:100000 IJ SOLN
INTRAMUSCULAR | Status: DC | PRN
  Administered 2015-08-11: 10 mL

## 2015-08-11 MED ORDER — LIDOCAINE-EPINEPHRINE 1 %-1:100000 IJ SOLN
INTRAMUSCULAR | Status: AC
  Filled 2015-08-11: qty 1

## 2015-08-11 MED ORDER — IBUPROFEN 100 MG/5ML PO SUSP
200.0000 mg | Freq: Four times a day (QID) | ORAL | Status: DC | PRN
  Filled 2015-08-11: qty 20

## 2015-08-11 MED ORDER — HYDROCODONE-ACETAMINOPHEN 7.5-325 MG PO TABS: 1.0000 | ORAL_TABLET | Freq: Once | ORAL | Status: DC | PRN

## 2015-08-11 MED ORDER — PROPOFOL 10 MG/ML IV BOLUS
INTRAVENOUS | Status: AC
  Filled 2015-08-11: qty 20

## 2015-08-11 MED ORDER — SCOPOLAMINE 1 MG/3DAYS TD PT72
MEDICATED_PATCH | TRANSDERMAL | Status: AC
  Administered 2015-08-11: 1.5 mg via TRANSDERMAL
  Filled 2015-08-11: qty 1

## 2015-08-11 MED ORDER — KETOROLAC TROMETHAMINE 30 MG/ML IJ SOLN: 30.0000 mg | Freq: Once | INTRAMUSCULAR | Status: DC

## 2015-08-11 MED ORDER — LIDOCAINE HCL (CARDIAC) 20 MG/ML IV SOLN
INTRAVENOUS | Status: AC
  Filled 2015-08-11: qty 5

## 2015-08-11 MED ORDER — SCOPOLAMINE 1 MG/3DAYS TD PT72
1.0000 | MEDICATED_PATCH | Freq: Once | TRANSDERMAL | Status: DC
  Administered 2015-08-11: 1.5 mg via TRANSDERMAL

## 2015-08-11 MED ORDER — KETOROLAC TROMETHAMINE 30 MG/ML IJ SOLN
INTRAMUSCULAR | Status: DC | PRN
  Administered 2015-08-11: 30 mg via INTRAVENOUS

## 2015-08-11 MED ORDER — DEXAMETHASONE SODIUM PHOSPHATE 10 MG/ML IJ SOLN
INTRAMUSCULAR | Status: DC | PRN
  Administered 2015-08-11: 4 mg via INTRAVENOUS

## 2015-08-11 MED ORDER — FENTANYL CITRATE (PF) 100 MCG/2ML IJ SOLN
INTRAMUSCULAR | Status: AC
  Filled 2015-08-11: qty 2

## 2015-08-11 MED ORDER — LIDOCAINE HCL (CARDIAC) 20 MG/ML IV SOLN
INTRAVENOUS | Status: DC | PRN
  Administered 2015-08-11: 80 mg via INTRAVENOUS

## 2015-08-11 MED ORDER — SILVER NITRATE-POT NITRATE 75-25 % EX MISC
CUTANEOUS | Status: AC
  Filled 2015-08-11: qty 1

## 2015-08-11 MED ORDER — IBUPROFEN 200 MG PO TABS
200.0000 mg | ORAL_TABLET | Freq: Four times a day (QID) | ORAL | Status: DC | PRN
  Filled 2015-08-11: qty 2

## 2015-08-11 MED ORDER — MEPERIDINE HCL 25 MG/ML IJ SOLN: 6.2500 mg | INTRAMUSCULAR | Status: DC | PRN

## 2015-08-11 MED ORDER — LACTATED RINGERS IV SOLN
INTRAVENOUS | Status: DC
  Administered 2015-08-11 (×2): via INTRAVENOUS

## 2015-08-11 MED ORDER — MIDAZOLAM HCL 2 MG/2ML IJ SOLN
INTRAMUSCULAR | Status: DC | PRN
  Administered 2015-08-11: 2 mg via INTRAVENOUS

## 2015-08-11 MED ORDER — ONDANSETRON HCL 4 MG/2ML IJ SOLN
INTRAMUSCULAR | Status: AC
  Filled 2015-08-11: qty 2

## 2015-08-11 MED ORDER — PROPOFOL 10 MG/ML IV BOLUS
INTRAVENOUS | Status: DC | PRN
Start: 2015-08-11 — End: 2015-08-11
  Administered 2015-08-11: 200 mg via INTRAVENOUS

## 2015-08-11 MED ORDER — DEXAMETHASONE SODIUM PHOSPHATE 4 MG/ML IJ SOLN
INTRAMUSCULAR | Status: AC
  Filled 2015-08-11: qty 1

## 2015-08-11 MED ORDER — ONDANSETRON HCL 4 MG/2ML IJ SOLN: 4.0000 mg | Freq: Once | INTRAMUSCULAR | Status: DC | PRN

## 2015-08-11 MED ORDER — FENTANYL CITRATE (PF) 100 MCG/2ML IJ SOLN: 25.0000 ug | INTRAMUSCULAR | Status: DC | PRN

## 2015-08-11 MED ORDER — ONDANSETRON HCL 4 MG/2ML IJ SOLN
INTRAMUSCULAR | Status: DC | PRN
  Administered 2015-08-11: 4 mg via INTRAVENOUS

## 2015-08-11 MED ORDER — FENTANYL CITRATE (PF) 100 MCG/2ML IJ SOLN
INTRAMUSCULAR | Status: DC | PRN
  Administered 2015-08-11 (×2): 50 ug via INTRAVENOUS

## 2015-08-11 MED ORDER — MIDAZOLAM HCL 2 MG/2ML IJ SOLN
INTRAMUSCULAR | Status: AC
  Filled 2015-08-11: qty 2

## 2015-08-11 SURGICAL SUPPLY — 22 items
ABLATOR ENDOMETRIAL BIPOLAR (ABLATOR) ×2 IMPLANT
CANISTER SUCT 3000ML (MISCELLANEOUS) ×2 IMPLANT
CATH ROBINSON RED A/P 16FR (CATHETERS) ×2 IMPLANT
CLOTH BEACON ORANGE TIMEOUT ST (SAFETY) ×2 IMPLANT
CONTAINER PREFILL 10% NBF 60ML (FORM) ×4 IMPLANT
DEVICE MYOSURE LITE (MISCELLANEOUS) ×2 IMPLANT
DEVICE MYOSURE REACH (MISCELLANEOUS) IMPLANT
DILATOR CANAL MILEX (MISCELLANEOUS) IMPLANT
ELECT REM PT RETURN 9FT ADLT (ELECTROSURGICAL)
ELECTRODE REM PT RTRN 9FT ADLT (ELECTROSURGICAL) IMPLANT
FILTER ARTHROSCOPY CONVERTOR (FILTER) ×2 IMPLANT
GLOVE BIOGEL PI IND STRL 7.0 (GLOVE) ×2 IMPLANT
GLOVE BIOGEL PI INDICATOR 7.0 (GLOVE) ×2
GLOVE ECLIPSE 6.5 STRL STRAW (GLOVE) ×4 IMPLANT
GOWN STRL REUS W/TWL LRG LVL3 (GOWN DISPOSABLE) ×4 IMPLANT
PACK VAGINAL MINOR WOMEN LF (CUSTOM PROCEDURE TRAY) ×2 IMPLANT
PAD OB MATERNITY 4.3X12.25 (PERSONAL CARE ITEMS) ×2 IMPLANT
SEAL ROD LENS SCOPE MYOSURE (ABLATOR) ×2 IMPLANT
TOWEL OR 17X24 6PK STRL BLUE (TOWEL DISPOSABLE) ×4 IMPLANT
TUBING AQUILEX INFLOW (TUBING) ×2 IMPLANT
TUBING AQUILEX OUTFLOW (TUBING) ×2 IMPLANT
WATER STERILE IRR 1000ML POUR (IV SOLUTION) ×2 IMPLANT

## 2015-08-11 NOTE — Discharge Instructions (Addendum)
Post-surgical Instructions, Outpatient Surgery  You may expect to feel dizzy, weak, and drowsy for as long as 24 hours after receiving the medicine that made you sleep (anesthetic). For the first 24 hours after your surgery:    Do not drive a car, ride a bicycle, participate in physical activities, or take public transportation until you are done taking narcotic pain medicines or as directed by Dr. Hyacinth MeekerMiller.   Do not drink alcohol or take tranquilizers.   Do not take medicine that has not been prescribed by your physicians.   Do not sign important papers or make important decisions while on narcotic pain medicines.   Have a responsible person with you.   PAIN MANAGEMENT  Motrin 800mg .  (This is the same as 4-200mg  over the counter tablets of Motrin or ibuprofen.)  You may take this every eight hours or as needed for cramping.    Vicodin 5/325mg .  For more severe pain, take one or two tablets every four to six hours as needed for pain control.  (Remember that narcotic pain medications increase your risk of constipation.  If this becomes a problem, you may take an over the counter stool softener like Colace 100mg  up to four times a day.)  If you are still having significant cramping, you can take a 5mg  Valium as well.  DO'S AND DON'T'S  Do not take a tub bath for one week.  You may shower on the first day after your surgery  Do not do any heavy lifting for one to two weeks.  This increases the chance of bleeding.  Do move around as you feel able.  Stairs are fine.  You may begin to exercise again as you feel able.  Do not lift any weights for two weeks.  Do not put anything in the vagina for two weeks--no tampons, intercourse, or douching.    REGULAR MEDIATIONS/VITAMINS:  You may restart all of your regular medications as prescribed.  You may restart all of your vitamins as you normally take them.    PLEASE CALL OR SEEK MEDICAL CARE IF:  You have persistent nausea and vomiting.     You have trouble eating or drinking.   You have an oral temperature above 100.5.   You have constipation that is not helped by adjusting diet or increasing fluid intake. Pain medicines are a common cause of constipation.   You have heavy vaginal bleeding     Post Anesthesia Home Care Instructions  NO IBUPROFEN PRODUCTS UNTIL: 3 PM TODAY  Activity: Get plenty of rest for the remainder of the day. A responsible adult should stay with you for 24 hours following the procedure.  For the next 24 hours, DO NOT: -Drive a car -Advertising copywriterperate machinery -Drink alcoholic beverages -Take any medication unless instructed by your physician -Make any legal decisions or sign important papers.  Meals: Start with liquid foods such as gelatin or soup. Progress to regular foods as tolerated. Avoid greasy, spicy, heavy foods. If nausea and/or vomiting occur, drink only clear liquids until the nausea and/or vomiting subsides. Call your physician if vomiting continues.  Special Instructions/Symptoms: Your throat may feel dry or sore from the anesthesia or the breathing tube placed in your throat during surgery. If this causes discomfort, gargle with warm salt water. The discomfort should disappear within 24 hours.  If you had a scopolamine patch placed behind your ear for the management of post- operative nausea and/or vomiting:  1. The medication in the patch is effective  for 72 hours, after which it should be removed.  Wrap patch in a tissue and discard in the trash. Wash hands thoroughly with soap and water. 2. You may remove the patch earlier than 72 hours if you experience unpleasant side effects which may include dry mouth, dizziness or visual disturbances. 3. Avoid touching the patch. Wash your hands with soap and water after contact with the patch.

## 2015-08-11 NOTE — Op Note (Signed)
08/11/2015  9:14 AM  PATIENT:  Nicole Hays  50 y.o. female  PRE-OPERATIVE DIAGNOSIS:  menorrhagia, anemia, endometrial polyp  POST-OPERATIVE DIAGNOSIS:  menorrhagia, anemia, endometrial polyp  PROCEDURE:  Procedure(s): DILATATION & CURETTAGE/HYSTEROSCOPY WITH MYOSURE NOVASURE ABLATION  SURGEON:  Ceil Roderick SUZANNE  ASSISTANTS: OR staff   ANESTHESIA:   general  ESTIMATED BLOOD LOSS: 5cc  BLOOD ADMINISTERED:none   FLUIDS: 1000cc LR  UOP: 100cc clear UOP  SPECIMEN:  Endometrial curettings and polyp  DISPOSITION OF SPECIMEN:  PATHOLOGY  FINDINGS: fluffy endometrium and fundal endometrial polyp  DESCRIPTION OF OPERATION: Patient was taken to the operating room.  She is placed in the supine position. SCDs were on her lower extremities and functioning properly. General anesthesia with an LMA was administered without difficulty.   Legs were then [facility] in the [facility] stirrups in the low lithotomy position. The legs were lifted to the high lithotomy position and the Betadine prep was used on the inner thighs perineum and vagina x3. Patient was draped in a normal standard fashion. An in and out catheterization with a red rubber Foley catheter was performed. Approximately 100 cc of clear urine was noted. A bivalve speculum was placed the vagina. The anterior lip of the cervix was grasped with single-tooth tenaculum.  A paracervical block of 1% lidocaine mixed one-to-one with epinephrine (1:100,000 units).  10 cc was used total. The cervix is dilated up to [facility] dilators. The endometrial cavity sounded to 8 cm.  Cervical length was 4cm.   A 2.9 millimeter diagnostic hysteroscope was obtained. Saline was used as a hysteroscopic fluid. The hysteroscope was advanced through the endocervical canal into the endometrial cavity. The tubal ostia were noted bilaterally. There a fundal polyp and fluffy endometrium anteriorly in the endometrial cavity.  #1 toothed curette was used to curette  the endometrium until a rough gritty texture was noted.  The hysteroscope was used the visualize the endometrium.  The polyp was still present so the Myosure Lite was obtained.  This was used to resect the polyp and then resect anteriorly to smooth the endometrial cavity.  Photodocumentation as obtained.   The cavity was revisualized and the irregular tissue and polyp were resected.  Next the Novasure device was obtained.  It was opened outside the endometrial cavity to ensure it was working appropriately.  Then the array was closed and the device was passed to the fundus.  The array was opened.  The device was seated.  The cavity was 4.5.  Power setting was 99.  Cavity assessment test was performed and passed without difficulty.  Next the ablation cycle was begun.  The cycle was 120 seconds.    Once the ablation was completed, the cavity was revisualized with hysteroscope.  The cavity appeared white and evidence of good ablation.  At this point, the procedure was ended.  The hysteroscope was removed. The fluid deficit was 280cc or saline. The tenaculum was removed from the anterior lip of the cervix.  Silver nitrate was used to have hemostasis of the tenaculum sites.  The speculum was removed from the vagina. The prep was cleansed of the patient's skin. The legs are positioned back in the supine position. Sponge, lap, needle, initially counts were correct x2. Patient was taken to recovery in stable condition.  COUNTS:  YES  PLAN OF CARE: Transfer to PACU

## 2015-08-11 NOTE — Anesthesia Procedure Notes (Signed)
Procedure Name: LMA Insertion Date/Time: 08/11/2015 8:40 AM Performed by: Yolonda KidaARVER, Jameil Whitmoyer L Pre-anesthesia Checklist: Patient identified, Emergency Drugs available, Suction available and Patient being monitored Patient Re-evaluated:Patient Re-evaluated prior to inductionOxygen Delivery Method: Circle system utilized Preoxygenation: Pre-oxygenation with 100% oxygen Intubation Type: IV induction Ventilation: Mask ventilation without difficulty LMA: LMA inserted LMA Size: 4.0 Number of attempts: 1 Airway Equipment and Method: Bite block Placement Confirmation: positive ETCO2,  CO2 detector and breath sounds checked- equal and bilateral Tube secured with: Tape Dental Injury: Teeth and Oropharynx as per pre-operative assessment

## 2015-08-11 NOTE — Anesthesia Postprocedure Evaluation (Signed)
Anesthesia Post Note  Patient: Nicole Hays  Procedure(s) Performed: Procedure(s) (LRB): DILATATION & CURETTAGE/HYSTEROSCOPY WITH MYOSURE (N/A) NOVASURE ABLATION (N/A)  Patient location during evaluation: PACU Anesthesia Type: General Level of consciousness: awake Pain management: pain level controlled Vital Signs Assessment: post-procedure vital signs reviewed and stable Respiratory status: spontaneous breathing Cardiovascular status: stable Postop Assessment: no signs of nausea or vomiting Anesthetic complications: no    Last Vitals:  Filed Vitals:   08/11/15 0930 08/11/15 0945  BP: 123/79 130/84  Pulse: 80 73  Temp:    Resp: 20 17    Last Pain:  Filed Vitals:   08/11/15 0951  PainSc: 0-No pain                 Gibbs Naugle JR,JOHN Kyre Jeffries

## 2015-08-11 NOTE — Transfer of Care (Signed)
Immediate Anesthesia Transfer of Care Note  Patient: Nicole CopaHeather P Spicher  Procedure(s) Performed: Procedure(s): DILATATION & CURETTAGE/HYSTEROSCOPY WITH MYOSURE (N/A) NOVASURE ABLATION (N/A)  Patient Location: PACU  Anesthesia Type:General  Level of Consciousness: awake, alert , oriented and patient cooperative  Airway & Oxygen Therapy: Patient Spontanous Breathing and Patient connected to nasal cannula oxygen  Post-op Assessment: Report given to RN and Post -op Vital signs reviewed and stable  Post vital signs: Reviewed and stable  Last Vitals:  Filed Vitals:   08/11/15 0721  BP: 141/88  Pulse: 78  Temp: 37.2 C  Resp: 16    Complications: No apparent anesthesia complications

## 2015-08-11 NOTE — H&P (Signed)
Nicole Hays is an 50 y.o. female  G2P2 MWF here for hysteroscopy, possible polyp removal and novasure ablation due to menorrhagia with irregular cycles.  Pt did have office endometrial biopsy showing benign tissue but a polyp was noted.  Ultrasound showed uterus 9 x 5.6 x 5cm.  Normal ovaries noted.  Pt has been counseled about alternatives including OCPs, progesterone methods, and Mirena IUD use.  She has decided to proceed with surgical intervention and is here for this today.  Pertinent Gynecological History: Menses: heavy but irregular Bleeding: menorrhagia Contraception: vasectomy DES exposure: denies Blood transfusions: none Sexually transmitted diseases: no past history Previous GYN Procedures: none except for above  Last mammogram: normal Date: 7/16 Last pap: normal Date: 3/17 OB History: G2, P2   Menstrual History: Patient's last menstrual period was 08/04/2015.    Past Medical History  Diagnosis Date  . IBS (irritable bowel syndrome)   . Asthma     rarely uses inhaler  . Seasonal allergies   . Anxiety   . GERD (gastroesophageal reflux disease)   . Migraines     last one years ago    Past Surgical History  Procedure Laterality Date  . Wisdom tooth extraction    . Foot surgery Bilateral     spurs    Family History  Problem Relation Age of Onset  . Osteoporosis Maternal Grandmother     Social History:  reports that she has never smoked. She has never used smokeless tobacco. She reports that she drinks about 1.8 - 2.4 oz of alcohol per week. She reports that she does not use illicit drugs.  Allergies:  Allergies  Allergen Reactions  . Ciprofloxacin Nausea And Vomiting  . Penicillins     Unknown reaction as a child    Prescriptions prior to admission  Medication Sig Dispense Refill Last Dose  . aspirin 81 MG tablet Take 81 mg by mouth daily.   Past Month at Unknown time  . Eletriptan Hydrobromide (RELPAX PO) Take 1 tablet by mouth daily as needed (For  migraine.).    Past Week at Unknown time  . esomeprazole (NEXIUM) 20 MG capsule Take 20 mg by mouth daily at 12 noon.   08/10/2015 at Unknown time  . ibuprofen (ADVIL,MOTRIN) 200 MG tablet Take 200 mg by mouth every 8 (eight) hours as needed for headache.     . loratadine (CLARITIN) 10 MG tablet Take 10 mg by mouth daily.     . Multiple Vitamin (MULTIVITAMIN WITH MINERALS) TABS tablet Take 1 tablet by mouth daily.     . Multiple Vitamins-Minerals (EMERGEN-C IMMUNE PLUS PO) Take 1 tablet by mouth daily as needed (To prevent cold.).     Marland Kitchen norethindrone (AYGESTIN) 5 MG tablet Take 2 tablets (10 mg total) by mouth 2 (two) times daily. 30 tablet 0 08/10/2015 at Unknown time  . pseudoephedrine (SUDAFED) 30 MG tablet Take 30 mg by mouth every 6 (six) hours as needed for congestion.     Marland Kitchen albuterol (PROVENTIL HFA;VENTOLIN HFA) 108 (90 Base) MCG/ACT inhaler Inhale into the lungs every 6 (six) hours as needed for wheezing or shortness of breath.   More than a month at Unknown time  . diazepam (VALIUM) 5 MG tablet Take 1 tablet (5 mg total) by mouth every 6 (six) hours as needed for anxiety. 10 tablet 0   . diphenoxylate-atropine (LOMOTIL) 2.5-0.025 MG per tablet Take by mouth 4 (four) times daily as needed for diarrhea or loose stools.   Taking  .  MAGNESIUM PO Take 250 mg by mouth daily.    Taking  . montelukast (SINGULAIR) 10 MG tablet Take 10 mg by mouth at bedtime.    Taking  . oxyCODONE-acetaminophen (PERCOCET) 5-325 MG tablet Take 2 tablets by mouth every 4 (four) hours as needed. use only as much as needed to relieve pain 12 tablet 0     Review of Systems  All other systems reviewed and are negative.   Blood pressure 141/88, pulse 78, temperature 99 F (37.2 C), temperature source Oral, resp. rate 16, height 5\' 3"  (1.6 m), weight 185 lb (83.915 kg), last menstrual period 08/04/2015, SpO2 100 %. Physical Exam  Constitutional: She is oriented to person, place, and time. She appears well-developed and  well-nourished.  Cardiovascular: Normal rate and regular rhythm.   Respiratory: Breath sounds normal.  Neurological: She is alert and oriented to person, place, and time.  Skin: Skin is warm and dry.  Psychiatric: She has a normal mood and affect.    No results found for this or any previous visit (from the past 24 hour(s)).  No results found.  Assessment/Plan: 50 yo G2P2 MWF with menorrhagia with irregular cycles and endometrial polyp noted on ultrasound and biopsy here for hysteroscopy, possible polyp removal and novasure ablation.  All questions answered and pt is ready to proceed.  Valentina ShaggyMILLER, Tarren Sabree SUZANNE 08/11/2015, 7:23 AM

## 2015-08-11 NOTE — Anesthesia Preprocedure Evaluation (Signed)
Anesthesia Evaluation  Patient identified by MRN, date of birth, ID band Patient awake    Reviewed: Allergy & Precautions, H&P , NPO status , Patient's Chart, lab work & pertinent test results  Airway Mallampati: II  TM Distance: >3 FB Neck ROM: full    Dental no notable dental hx. (+) Teeth Intact   Pulmonary  Well controlled with Singulair.   Pulmonary exam normal        Cardiovascular negative cardio ROS Normal cardiovascular exam     Neuro/Psych    GI/Hepatic Neg liver ROS, GERD  Medicated and Controlled,  Endo/Other  negative endocrine ROS  Renal/GU negative Renal ROS     Musculoskeletal   Abdominal Normal abdominal exam  (+)   Peds  Hematology negative hematology ROS (+)   Anesthesia Other Findings   Reproductive/Obstetrics negative OB ROS                             Anesthesia Physical Anesthesia Plan  ASA: II  Anesthesia Plan: General   Post-op Pain Management:    Induction: Intravenous  Airway Management Planned: LMA  Additional Equipment:   Intra-op Plan:   Post-operative Plan:   Informed Consent: I have reviewed the patients History and Physical, chart, labs and discussed the procedure including the risks, benefits and alternatives for the proposed anesthesia with the patient or authorized representative who has indicated his/her understanding and acceptance.     Plan Discussed with: CRNA and Surgeon  Anesthesia Plan Comments:         Anesthesia Quick Evaluation

## 2015-08-12 ENCOUNTER — Encounter (HOSPITAL_COMMUNITY): Payer: Self-pay | Admitting: Obstetrics & Gynecology

## 2015-08-20 ENCOUNTER — Telehealth: Payer: Self-pay | Admitting: Obstetrics & Gynecology

## 2015-08-20 NOTE — Telephone Encounter (Signed)
Patient had a D&C with Dr.Miller on 08/11/2015. 2 week post op appointment was scheduled for 08/22/2015. Patient states she was advised by Dr.Miller she may move her post op appointment one month out if she was doing well. Reports she is doing well and would like to move this appointment.  Dr.Miller, is it okay to move this patient's appointment to May?

## 2015-08-20 NOTE — Telephone Encounter (Signed)
Patient says she was told by Dr.Miller that if she is dong fine she could cancel her post op appointment from Friday and reschedule a month later.

## 2015-08-22 ENCOUNTER — Ambulatory Visit: Payer: BC Managed Care – PPO | Admitting: Obstetrics & Gynecology

## 2015-08-22 NOTE — Telephone Encounter (Signed)
Yes.  This is fine to move into May.  Thanks.

## 2015-08-22 NOTE — Telephone Encounter (Signed)
Left message to call Breslin Burklow at [phone]. 

## 2015-08-25 NOTE — Telephone Encounter (Signed)
Left message to call Kaitlyn at [phone]. 

## 2015-09-01 NOTE — Telephone Encounter (Signed)
Dr.Miller, I have attempted to reach this patient x 2 to reschedule her 2 week post op appointment with no return call. Please advise on how to proceed at this time.

## 2015-09-02 NOTE — Telephone Encounter (Signed)
OK to send a letter.  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  Thanks.

## 2015-09-02 NOTE — Telephone Encounter (Signed)
Letter written and sent to patient's home address on file.  Routing to provider for final review. Patient agreeable to disposition. Will close encounter.

## 2015-09-03 ENCOUNTER — Telehealth: Payer: Self-pay | Admitting: Obstetrics & Gynecology

## 2015-09-03 NOTE — Telephone Encounter (Signed)
Patient returning call to reschedule an appointment.

## 2015-09-03 NOTE — Telephone Encounter (Signed)
Spoke with patient. Patient would like to reschedule her post-operative appointment with Dr.Miller. 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 Appointment rescheduled to 10/23/2015 at 10 am with Dr.Miller. She is agreeable to date and time.  Routing to provider for final review. Patient agreeable to disposition. Will close encounter.

## 2015-10-20 ENCOUNTER — Other Ambulatory Visit: Payer: Self-pay | Admitting: Obstetrics & Gynecology

## 2015-10-20 DIAGNOSIS — Z1231 Encounter for screening mammogram for malignant neoplasm of breast: Secondary | ICD-10-CM

## 2015-10-23 ENCOUNTER — Ambulatory Visit (INDEPENDENT_AMBULATORY_CARE_PROVIDER_SITE_OTHER): Payer: BC Managed Care – PPO | Admitting: Obstetrics & Gynecology

## 2015-10-23 ENCOUNTER — Encounter: Payer: Self-pay | Admitting: Obstetrics & Gynecology

## 2015-10-23 VITALS — BP 118/66 | HR 74 | Resp 14 | Wt 185.0 lb

## 2015-10-23 DIAGNOSIS — N840 Polyp of corpus uteri: Secondary | ICD-10-CM

## 2015-10-23 DIAGNOSIS — N921 Excessive and frequent menstruation with irregular cycle: Secondary | ICD-10-CM | POA: Diagnosis not present

## 2015-10-23 MED ORDER — DIAZEPAM 5 MG PO TABS: 5.0000 mg | ORAL_TABLET | Freq: Four times a day (QID) | ORAL | Status: DC | PRN

## 2015-10-23 NOTE — Progress Notes (Signed)
GYNECOLOGY  VISIT   HPI: 50 y.o. [id number] Married Caucasian female here for follow up after having hysteroscopy with polyp removal and Novasure endometrial ablation.  Reports she did well after her surgery.  Took very little pain medications.  Spotted for about two weeks with watery discharge.  Denies any vaginal bleeding since then.  Does have menstrual migraines right before her cycle.  She reports that she took a Valium for her migraines instead of her Relpax because she feels so "terrible" with the Relpax.  She's been on other migraine medications, like Imitrex, which has worse side effects.  Would like an rx for valium on hand, if possible.    GYNECOLOGIC HISTORY: Patient's last menstrual period was 08/05/2015. Contraception: vasectomy   Patient Active Problem List   Diagnosis Date Noted  . Endometrial polyp 07/31/2015  . Menorrhagia with irregular cycle 07/27/2015  . Anemia due to chronic blood loss 07/27/2015    Past Medical History  Diagnosis Date  . IBS (irritable bowel syndrome)   . Asthma     rarely uses inhaler  . Seasonal allergies   . Anxiety   . GERD (gastroesophageal reflux disease)   . Migraines     last one years ago    Past Surgical History  Procedure Laterality Date  . Wisdom tooth extraction    . Foot surgery Bilateral     spurs  . Dilatation & curettage/hysteroscopy with myosure N/A 08/11/2015    Procedure: DILATATION & CURETTAGE/HYSTEROSCOPY WITH MYOSURE;  Surgeon: Jerene BearsMary S Alexi Dorminey, MD;  Location: [facility];  Service: Gynecology;  Laterality: N/A;  . Novasure ablation N/A 08/11/2015    Procedure: NOVASURE ABLATION;  Surgeon: Jerene BearsMary S Darleene Cumpian, MD;  Location: [facility];  Service: Gynecology;  Laterality: N/A;    MEDS:  Reviewed in EPIC and UTD  ALLERGIES: Ciprofloxacin and Penicillins  Family History  Problem Relation Age of Onset  . Osteoporosis Maternal Grandmother     SH:  Married, non smoker  Review of Systems  All other systems reviewed and are  negative.   PHYSICAL EXAMINATION:    BP 118/66 mmHg  Pulse 74  Resp 14  Wt 185 lb (83.915 kg)  LMP 08/05/2015    General appearance: alert, cooperative and appears stated age Abdomen: soft, non-tender; bowel sounds normal; no masses,  no organomegaly  Pelvic: External genitalia:  no lesions              Urethra:  normal appearing urethra with no masses, tenderness or lesions              Bartholins and Skenes: normal                 Vagina: normal appearing vagina with normal color and discharge, no lesions              Cervix: no lesions              Bimanual Exam:  Uterus:  normal size, contour, position, consistency, mobility, non-tender              Adnexa: no mass, fullness, tenderness              Anus:  normal sphincter tone, no lesions  Assessment: H/O DUB with menorrhagia and endometrial polyp s/p hysteroscopy and endometrial ablation Migraines  Plan: Follow up for AEX Rx for valium 5mg  prn.  She will take 1/2 tab to see if this is enough.  #30/0RF   ~15 minutes spent with patient >50%  of time was in face to face discussion of above.

## 2015-10-27 ENCOUNTER — Ambulatory Visit: Payer: BC Managed Care – PPO | Admitting: Nurse Practitioner

## 2015-11-03 ENCOUNTER — Ambulatory Visit
Admission: RE | Admit: 2015-11-03 | Discharge: 2015-11-03 | Disposition: A | Discharge status: Home / Self Care | Payer: BC Managed Care – PPO | Source: Ambulatory Visit | Attending: Obstetrics & Gynecology | Admitting: Obstetrics & Gynecology

## 2015-11-03 DIAGNOSIS — Z1231 Encounter for screening mammogram for malignant neoplasm of breast: Secondary | ICD-10-CM

## 2016-08-13 ENCOUNTER — Ambulatory Visit: Payer: BC Managed Care – PPO | Admitting: Obstetrics & Gynecology

## 2016-08-18 NOTE — Progress Notes (Signed)
Patient ID: Nicole Hays, female   DOB: [birth year], 51 y.o.   MRN: [id number]   51 y.o. [id number] MarriedCaucasianF here for annual exam.  Doing well.  Pt reports bleeding has been very minimal.  Very pleased with results of endometrial ablation.  PCP:  Dr. Zachery Dauer.  Blood work showed cholesterol was mildly elevated.  This was done within the last month.  Patient's last menstrual period was 08/02/2015 (exact date).          Sexually active: Yes.    The current method of family planning is vasectomy.    Exercising: Yes.    Walking  Smoker:  no  Health Maintenance: Pap: 07-28-15 Neg; 12-18-12 Neg:Neg HR HPV History of abnormal Pap:  no MMG: 11-03-15 Density C/Neg/BiRads1:TBC Colonoscopy:  n/a BMD:   n/a TDaP:  12-16-12 Pneumonia vaccine(s):  no Zostavax:   no Hep C testing: no Screening Labs: done with PCP, Hb today: PCP, Urine today: not done   reports that she has never smoked. She has never used smokeless tobacco. She reports that she drinks about 1.8 - 2.4 oz of alcohol per week . She reports that she does not use drugs.  Past Medical History:  Diagnosis Date  . Anxiety   . Asthma    rarely uses inhaler  . GERD (gastroesophageal reflux disease)   . IBS (irritable bowel syndrome)   . Migraines    last one years ago  . Seasonal allergies     Past Surgical History:  Procedure Laterality Date  . DILATATION & CURETTAGE/HYSTEROSCOPY WITH MYOSURE N/A 08/11/2015   Procedure: DILATATION & CURETTAGE/HYSTEROSCOPY WITH MYOSURE;  Surgeon: Jerene Bears, MD;  Location: [facility];  Service: Gynecology;  Laterality: N/A;  . FOOT SURGERY Bilateral    spurs  . NOVASURE ABLATION N/A 08/11/2015   Procedure: NOVASURE ABLATION;  Surgeon: Jerene Bears, MD;  Location: [facility];  Service: Gynecology;  Laterality: N/A;  . WISDOM TOOTH EXTRACTION      Current Outpatient Prescriptions  Medication Sig Dispense Refill  . albuterol (PROVENTIL HFA;VENTOLIN HFA) 108 (90 Base) MCG/ACT inhaler Inhale into the  lungs every 6 (six) hours as needed for wheezing or shortness of breath.    Marland Kitchen aspirin 81 MG tablet Take 81 mg by mouth daily.    . diazepam (VALIUM) 5 MG tablet Take 1 tablet (5 mg total) by mouth every 6 (six) hours as needed for anxiety. 30 tablet 0  . diphenoxylate-atropine (LOMOTIL) 2.5-0.025 MG per tablet Take by mouth 4 (four) times daily as needed for diarrhea or loose stools.    . Eletriptan Hydrobromide (RELPAX PO) Take 1 tablet by mouth daily as needed (For migraine.).     Marland Kitchen ibuprofen (ADVIL,MOTRIN) 200 MG tablet Take 200 mg by mouth every 8 (eight) hours as needed for headache.    . loratadine (CLARITIN) 10 MG tablet Take 10 mg by mouth daily.    Marland Kitchen MAGNESIUM PO Take 250 mg by mouth daily.     . montelukast (SINGULAIR) 10 MG tablet Take 10 mg by mouth at bedtime.     . Multiple Vitamin (MULTIVITAMIN WITH MINERALS) TABS tablet Take 1 tablet by mouth daily.    . Multiple Vitamins-Minerals (EMERGEN-C IMMUNE PLUS PO) Take 1 tablet by mouth daily as needed (To prevent cold.). Reported on 10/23/2015    . pseudoephedrine (SUDAFED) 30 MG tablet Take 30 mg by mouth every 6 (six) hours as needed for congestion.     No current facility-administered medications for this visit.  Family History  Problem Relation Age of Onset  . Osteoporosis Maternal Grandmother   . Hypertension Mother   . Stroke Mother   . Hyperlipidemia Father   . Stroke Maternal Uncle     ROS:  Pertinent items are noted in HPI.  Otherwise, a comprehensive ROS was negative.  Exam:   BP 122/78 (BP Location: Right Arm, Patient Position: Sitting, Cuff Size: Normal)   Pulse 64   Resp 16   Ht  (1.575 m)   Wt 182 lb (82.6 kg)   LMP 08/02/2015 (Exact Date)   BMI 33.29 kg/m   Weight change: +8#  Height:  (157.5 cm)  Ht Readings from Last 3 Encounters:  08/24/16  (1.575 m)  08/01/15  (1.6 m)  07/29/15  (1.6 m)    General appearance: alert, cooperative and appears stated age Head:  Normocephalic, without obvious abnormality, atraumatic Neck: no adenopathy, supple, symmetrical, trachea midline and thyroid normal to inspection and palpation Lungs: clear to auscultation bilaterally Breasts: normal appearance, no masses or tenderness Heart: regular rate and rhythm Abdomen: soft, non-tender; bowel sounds normal; no masses,  no organomegaly Extremities: extremities normal, atraumatic, no cyanosis or edema Skin: Skin color, texture, turgor normal. No rashes or lesions Lymph nodes: Cervical, supraclavicular, and axillary nodes normal. No abnormal inguinal nodes palpated Neurologic: Grossly normal   Pelvic: External genitalia:  no lesions              Urethra:  normal appearing urethra with no masses, tenderness or lesions              Bartholins and Skenes: normal                 Vagina: normal appearing vagina with normal color and discharge, no lesions              Cervix: no lesions              Pap taken: Yes.   Bimanual Exam:  Uterus:  normal size, contour, position, consistency, mobility, non-tender              Adnexa: normal adnexa and no mass, fullness, tenderness               Rectovaginal: Confirms               Anus:  normal sphincter tone, no lesions  Chaperone was present for exam.  A:  Well Woman with normal exam H/O endometrial ablation 4/17 Vasectomy in husband GERD Mildly elevated lipids  P:   Mammogram guidelines reviewed.  Recommended 3D MMG due to dense breast tissue pap smear with HR HPV obtained today Lab work and vaccines with PCP earlier this year return annually or prn

## 2016-08-24 ENCOUNTER — Ambulatory Visit (INDEPENDENT_AMBULATORY_CARE_PROVIDER_SITE_OTHER): Payer: BC Managed Care – PPO | Admitting: Obstetrics & Gynecology

## 2016-08-24 ENCOUNTER — Encounter: Payer: Self-pay | Admitting: Obstetrics & Gynecology

## 2016-08-24 ENCOUNTER — Other Ambulatory Visit (HOSPITAL_COMMUNITY)
Admission: RE | Admit: 2016-08-24 | Discharge: 2016-08-24 | Disposition: A | Discharge status: Home / Self Care | Payer: BC Managed Care – PPO | Source: Ambulatory Visit | Attending: Obstetrics & Gynecology | Admitting: Obstetrics & Gynecology

## 2016-08-24 VITALS — BP 122/78 | HR 64 | Resp 16 | Ht 62.0 in | Wt 182.0 lb

## 2016-08-24 DIAGNOSIS — Z01419 Encounter for gynecological examination (general) (routine) without abnormal findings: Secondary | ICD-10-CM | POA: Diagnosis not present

## 2016-08-24 DIAGNOSIS — Z124 Encounter for screening for malignant neoplasm of cervix: Secondary | ICD-10-CM | POA: Insufficient documentation

## 2016-08-25 LAB — CYTOLOGY - PAP
Diagnosis: NEGATIVE
HPV: NOT DETECTED

## 2016-10-21 ENCOUNTER — Other Ambulatory Visit: Payer: Self-pay | Admitting: Obstetrics & Gynecology

## 2016-10-21 DIAGNOSIS — Z1231 Encounter for screening mammogram for malignant neoplasm of breast: Secondary | ICD-10-CM

## 2016-11-04 ENCOUNTER — Ambulatory Visit
Admission: RE | Admit: 2016-11-04 | Discharge: 2016-11-04 | Disposition: A | Discharge status: Home / Self Care | Payer: BC Managed Care – PPO | Source: Ambulatory Visit | Attending: Obstetrics & Gynecology | Admitting: Obstetrics & Gynecology

## 2016-11-04 DIAGNOSIS — Z1231 Encounter for screening mammogram for malignant neoplasm of breast: Secondary | ICD-10-CM

## 2017-03-01 ENCOUNTER — Encounter: Payer: Self-pay | Admitting: *Deleted

## 2017-03-01 ENCOUNTER — Emergency Department
Admission: EM | Admit: 2017-03-01 | Discharge: 2017-03-01 | Disposition: A | Discharge status: Home / Self Care | Payer: BC Managed Care – PPO | Attending: Emergency Medicine | Admitting: Emergency Medicine

## 2017-03-01 DIAGNOSIS — I4891 Unspecified atrial fibrillation: Secondary | ICD-10-CM | POA: Insufficient documentation

## 2017-03-01 DIAGNOSIS — Z79899 Other long term (current) drug therapy: Secondary | ICD-10-CM | POA: Insufficient documentation

## 2017-03-01 DIAGNOSIS — Z7982 Long term (current) use of aspirin: Secondary | ICD-10-CM | POA: Diagnosis not present

## 2017-03-01 DIAGNOSIS — J45909 Unspecified asthma, uncomplicated: Secondary | ICD-10-CM | POA: Diagnosis not present

## 2017-03-01 DIAGNOSIS — R002 Palpitations: Secondary | ICD-10-CM | POA: Diagnosis present

## 2017-03-01 LAB — COMPREHENSIVE METABOLIC PANEL
ALBUMIN: 4.5 g/dL (ref 3.5–5.0)
ALK PHOS: 65 U/L (ref 38–126)
ALT: 14 U/L (ref 14–54)
ANION GAP: 11 (ref 5–15)
AST: 26 U/L (ref 15–41)
BILIRUBIN TOTAL: 0.7 mg/dL (ref 0.3–1.2)
BUN: 11 mg/dL (ref 6–20)
CALCIUM: 10.5 mg/dL — AB (ref 8.9–10.3)
CO2: 29 mmol/L (ref 22–32)
Chloride: 104 mmol/L (ref 101–111)
Creatinine, Ser: 0.86 mg/dL (ref 0.44–1.00)
GFR calc Af Amer: >60 mL/min (ref >60)
GFR calc non Af Amer: >60 mL/min (ref >60)
Glucose, Bld: 114 mg/dL — ABNORMAL HIGH (ref 65–99)
POTASSIUM: 3 mmol/L — AB (ref 3.5–5.1)
Sodium: 144 mmol/L (ref 135–145)
TOTAL PROTEIN: 8.5 g/dL — AB (ref 6.5–8.1)

## 2017-03-01 LAB — CBC WITH DIFFERENTIAL/PLATELET
BASOS ABS: 0.1 10*3/uL (ref 0–0.1)
Basophils Relative: 1 %
EOS ABS: 0.2 10*3/uL (ref 0–0.7)
EOS PCT: 1 %
HCT: 46.9 % (ref 35.0–47.0)
Hemoglobin: 15.5 g/dL (ref 12.0–16.0)
Lymphocytes Relative: 24 %
Lymphs Abs: 3.1 10*3/uL (ref 1.0–3.6)
MCH: 29.6 pg (ref 26.0–34.0)
MCHC: 33 g/dL (ref 32.0–36.0)
MCV: 89.6 fL (ref 80.0–100.0)
MONO ABS: 0.9 10*3/uL (ref 0.2–0.9)
Monocytes Relative: 7 %
Neutro Abs: 8.6 10*3/uL — ABNORMAL HIGH (ref 1.4–6.5)
Neutrophils Relative %: 67 %
Platelets: 291 10*3/uL (ref 150–440)
RBC: 5.24 MIL/uL — AB (ref 3.80–5.20)
RDW: 13.6 % (ref 11.5–14.5)
WBC: 12.8 10*3/uL — AB (ref 3.6–11.0)

## 2017-03-01 LAB — TROPONIN I: TROPONIN I: 0.03 ng/mL — AB (ref <0.03)

## 2017-03-01 MED ORDER — METOPROLOL TARTRATE 25 MG PO TABS
25.0000 mg | ORAL_TABLET | Freq: Once | ORAL | Status: AC
  Administered 2017-03-01: 25 mg via ORAL
  Filled 2017-03-01: qty 1

## 2017-03-01 MED ORDER — METOPROLOL TARTRATE 25 MG PO TABS: 25.0000 mg | ORAL_TABLET | Freq: Two times a day (BID) | ORAL | 0 refills | Status: DC

## 2017-03-01 MED ORDER — APIXABAN 5 MG PO TABS: 5.0000 mg | ORAL_TABLET | Freq: Two times a day (BID) | ORAL | Status: DC

## 2017-03-01 MED ORDER — SODIUM CHLORIDE 0.9 % IV BOLUS (SEPSIS)
1000.0000 mL | Freq: Once | INTRAVENOUS | Status: AC
  Administered 2017-03-01: 1000 mL via INTRAVENOUS

## 2017-03-01 MED ORDER — APIXABAN 5 MG PO TABS
5.0000 mg | ORAL_TABLET | Freq: Once | ORAL | Status: AC
  Administered 2017-03-01: 5 mg via ORAL

## 2017-03-01 MED ORDER — DILTIAZEM LOAD VIA INFUSION
10.0000 mg | Freq: Once | INTRAVENOUS | Status: DC
Start: 2017-03-01 — End: 2017-03-01
  Filled 2017-03-01: qty 10

## 2017-03-01 MED ORDER — APIXABAN 5 MG PO TABS
ORAL_TABLET | ORAL | Status: AC
  Filled 2017-03-01: qty 1

## 2017-03-01 MED ORDER — DILTIAZEM HCL 100 MG IV SOLR
5.0000 mg/h | INTRAVENOUS | Status: DC
  Filled 2017-03-01: qty 100

## 2017-03-01 MED ORDER — APIXABAN 5 MG PO TABS: 5.0000 mg | ORAL_TABLET | Freq: Two times a day (BID) | ORAL | 0 refills | Status: DC

## 2017-03-01 NOTE — ED Notes (Signed)
Due to events in the ED, lab draw was delayed. Apologies were made to the patient and her husband. Dr. Pershing ProudSchaevitz aware.

## 2017-03-01 NOTE — ED Notes (Signed)
First Nurse Note:  Radial pulse 112 and regular on arrival.

## 2017-03-01 NOTE — ED Notes (Signed)
Dr. Schaevitz aware of Troponin of 0.03. 

## 2017-03-01 NOTE — ED Provider Notes (Signed)
[facility] Emergency Department Provider Note  ____________________________________________   First MD Initiated Contact with Patient 03/01/17 1452     (approximate)  I have reviewed the triage vital signs and the nursing notes.   HISTORY  Chief Complaint Palpitations   HPI Nicole Hays is a 51 y.o. female a history of palpitations who is presenting to the emergency department with a rapid heart rate.  She states that she was feeling fine today when about 1 or 115 she started having palpitations.  She says that she has had palpitations in the past but these palpitations are persistent.  She says that the palpitations in the past only lasted several seconds.  [place], [place], who have not diagnosed her with any specific arrhythmia.  The patient is not taking any medications.  Only drinks occasionally.  No drug use.  History of stroke in family but unknown if history of atrial fibrillation.  Patient also reporting some mild cramping in her left arm but says this feels like a muscle cramp and had this similar pain several days ago as well.  Past Medical History:  Diagnosis Date  . Anxiety   . Asthma    rarely uses inhaler  . GERD (gastroesophageal reflux disease)   . IBS (irritable bowel syndrome)   . Migraines    last one years ago  . Seasonal allergies     Patient Active Problem List   Diagnosis Date Noted  . Endometrial polyp 07/31/2015  . Menorrhagia with irregular cycle 07/27/2015  . Anemia due to chronic blood loss 07/27/2015    Past Surgical History:  Procedure Laterality Date  . DILATATION & CURETTAGE/HYSTEROSCOPY WITH MYOSURE N/A 08/11/2015   Procedure: DILATATION & CURETTAGE/HYSTEROSCOPY WITH MYOSURE;  Surgeon: Jerene BearsMary S Miller, MD;  Location: [facility];  Service: Gynecology;  Laterality: N/A;  . FOOT SURGERY Bilateral    spurs  . NOVASURE ABLATION N/A 08/11/2015   Procedure: NOVASURE ABLATION;   Surgeon: Jerene BearsMary S Miller, MD;  Location: [facility];  Service: Gynecology;  Laterality: N/A;  . WISDOM TOOTH EXTRACTION      Prior to Admission medications   Medication Sig Start Date End Date Taking? Authorizing Provider  albuterol (PROVENTIL HFA;VENTOLIN HFA) 108 (90 Base) MCG/ACT inhaler Inhale into the lungs every 6 (six) hours as needed for wheezing or shortness of breath.   Yes [provider]  aspirin 81 MG tablet Take 81 mg by mouth daily.   Yes [provider]  diphenoxylate-atropine (LOMOTIL) 2.5-0.025 MG per tablet Take by mouth 4 (four) times daily as needed for diarrhea or loose stools.   Yes [provider]  Eletriptan Hydrobromide (RELPAX PO) Take 1 tablet by mouth daily as needed (For migraine.).    Yes [provider]  ibuprofen (ADVIL,MOTRIN) 200 MG tablet Take 200 mg by mouth every 8 (eight) hours as needed for headache.   Yes [provider]  loratadine (CLARITIN) 10 MG tablet Take 10 mg by mouth daily.   Yes [provider]  MAGNESIUM PO Take 250 mg by mouth daily.    Yes [provider]  montelukast (SINGULAIR) 10 MG tablet Take 10 mg by mouth at bedtime.  10/19/12  Yes [provider]  Multiple Vitamin (MULTIVITAMIN WITH MINERALS) TABS tablet Take 1 tablet by mouth daily.   Yes [provider]  Multiple Vitamins-Minerals (EMERGEN-C IMMUNE PLUS PO) Take 1 tablet by mouth daily as needed (To prevent cold.). Reported  on 10/23/2015   Yes [provider]  pseudoephedrine (SUDAFED) 30 MG tablet Take 30 mg by mouth every 6 (six) hours as needed for congestion.   Yes [provider]  diazepam (VALIUM) 5 MG tablet Take 1 tablet (5 mg total) by mouth every 6 (six) hours as needed for anxiety. Patient not taking: Reported on 03/01/2017 10/23/15   Jerene Bears, MD    Allergies Ciprofloxacin and Penicillins  Family History  Problem Relation Age of Onset  . Osteoporosis Maternal Grandmother    . Hypertension Mother   . Stroke Mother   . Hyperlipidemia Father   . Stroke Maternal Uncle     Social History Social History  Substance Use Topics  . Smoking status: Never Smoker  . Smokeless tobacco: Never Used  . Alcohol use 1.8 - 2.4 oz/week    2 Glasses of wine, 1 - 2 Standard drinks or equivalent per week    Review of Systems  Constitutional: No fever/chills Eyes: No visual changes. ENT: No sore throat. Cardiovascular: Denies chest pain. Respiratory: Denies shortness of breath. Gastrointestinal: No abdominal pain.  No nausea, no vomiting.  No diarrhea.  No constipation. Genitourinary: Negative for dysuria. Musculoskeletal: Negative for back pain. Skin: Negative for rash. Neurological: Negative for headaches, focal weakness or numbness.   ____________________________________________   PHYSICAL EXAM:  VITAL SIGNS: ED Triage Vitals  Enc Vitals Group     BP 03/01/17 1441 (!) 122/99     Pulse Rate 03/01/17 1441 (!) 135     Resp 03/01/17 1441 18     Temp 03/01/17 1441 99.5 F (37.5 C)     Temp Source 03/01/17 1441 Oral     SpO2 03/01/17 1441 98 %     Weight 03/01/17 1439 190 lb (86.2 kg)     Height 03/01/17 1439 5\' 3"  (1.6 m)     Head Circumference --      Peak Flow --      Pain Score --      Pain Loc --      Pain Edu? --      Excl. in GC? --     Constitutional: Alert and oriented. Well appearing and in no acute distress. Eyes: Conjunctivae are normal.  Head: Atraumatic. Nose: No congestion/rhinnorhea. Mouth/Throat: Mucous membranes are moist.  Neck: No stridor.   Cardiovascular: Rapid heart rate with an irregularly irregular rhythm. Grossly normal heart sounds.   Respiratory: Normal respiratory effort.  No retractions. Lungs CTAB. Gastrointestinal: Soft and nontender. No distention.  Musculoskeletal: No lower extremity tenderness nor edema.  No joint effusions. Neurologic:  Normal speech and language. No gross focal neurologic deficits are  appreciated. Skin:  Skin is warm, dry and intact. No rash noted. Psychiatric: Mood and affect are normal. Speech and behavior are normal.  ____________________________________________   LABS (all labs ordered are listed, but only abnormal results are displayed)  Labs Reviewed  CBC WITH DIFFERENTIAL/PLATELET - Abnormal; Notable for the following:       Result Value   WBC 12.8 (*)    RBC 5.24 (*)    Neutro Abs 8.6 (*)    All other components within normal limits  COMPREHENSIVE METABOLIC PANEL - Abnormal; Notable for the following:    Potassium 3.0 (*)    Glucose, Bld 114 (*)    Calcium 10.5 (*)    Total Protein 8.5 (*)    All other components within normal limits  TROPONIN I - Abnormal; Notable for the following:  Troponin I 0.03 (*)    All other components within normal limits  TROPONIN I   ____________________________________________  EKG  ED ECG REPORT I, Schaevitz,  Teena Irani, the attending physician, personally viewed and interpreted this ECG.   Date: 03/01/2017  EKG Time: 1439  Rate: 129  Rhythm: atrial fibrillation, rate 129  Axis: Left axis  Intervals:none  ST&T Change: No ST segment elevation or depression.  No abnormal T wave inversion.  ED ECG REPORT I, Arelia Longest, the attending physician, personally viewed and interpreted this ECG.   Date: 03/01/2017  EKG Time: 1608  Rate: 104  Rhythm: atrial fibrillation, rate 104  Axis: Left  Intervals:none  ST&T Change: No ST segment elevation or depression.  No abnormal T wave inversion.    ____________________________________________  RADIOLOGY   ____________________________________________   PROCEDURES  Procedure(s) performed:   Procedures  Critical Care performed:   ____________________________________________   INITIAL IMPRESSION / ASSESSMENT AND PLAN / ED COURSE  Pertinent labs & imaging results that were available during my care of the patient were reviewed by me and considered  in my medical decision making (see chart for details).  DDX: Palpitations, PVCs, atrial fibrillation, ventricular tachycardia, MI  As part of my medical decision making, I reviewed the following data within the electronic MEDICAL RECORD NUMBER Old chart reviewed  ----------------------------------------- 4:16 PM on 03/01/2017 -----------------------------------------  Patient's heart rate is now in the 90s to the low 100s.  She is asymptomatic.  She has not been given any medications yet at this time.  I discussed the case again with Dr. and he recommends 25 mg of metoprolol twice daily as well as Eliquis, 5 mg twice daily.  We will recheck the patient's troponin at 5:30 PM.  Likely outpatient follow-up.      ----------------------------------------- 7:35 PM on 03/01/2017 -----------------------------------------  Patient's heart rate now is in the 60s and 70s.  She is asymptomatic.  Second troponin is undetectable.  The patient will be discharged for outpatient follow-up with cardiology.  We discussed the risks and benefits of Eliquis and the patient is understanding and willing to take this medication in order to prevent stroke.  ____________________________________________   FINAL CLINICAL IMPRESSION(S) / ED DIAGNOSES  Atrial fibrillation with RVR.    NEW MEDICATIONS STARTED DURING THIS VISIT:  New Prescriptions   No medications on file     Note:  This document was prepared using Dragon voice recognition software and may include unintentional dictation errors.     Myrna Blazer, MD 03/01/17 Barry Brunner

## 2017-03-01 NOTE — ED Triage Notes (Signed)
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

## 2017-03-02 ENCOUNTER — Encounter: Payer: Self-pay | Admitting: *Deleted

## 2017-03-02 DIAGNOSIS — I480 Paroxysmal atrial fibrillation: Secondary | ICD-10-CM | POA: Insufficient documentation

## 2017-03-02 NOTE — Progress Notes (Signed)
Cardiology Office Note:    Date:  03/03/2017   ID:  Nicole Hays, DOB June 03, 1965, MRN [id number]  PCP:  Juluis RainierBarnes, Elizabeth, MD  Cardiologist:  Norman HerrlichBrian Munley, MD   Referring MD: Juluis RainierBarnes, Elizabeth, MD  ASSESSMENT:    1. Paroxysmal atrial fibrillation (HCC)   2. Snoring    PLAN:    In order of problems listed above:  1. Improved she has resumed sinus rhythm.  I have asked her to stay on anticoagulant 30 days until reassessed in the office after echocardiogram.  She will initiate an antiarrhythmic drug Multaq follow-up EKG in 5 days continue beta-blocker and anticoagulant.  Further evaluation will include CMP and TSH in 2 weeks sleep study looking for a precipitant of severe obstructive sleep apnea echocardiogram for left atrial size and left ventricular dysfunction. 2. At high risk of sleep apnea sleep study ordered  Next appointment one month   Medication Adjustments/Labs and Tests Ordered: Current medicines are reviewed at length with the patient today.  Concerns regarding medicines are outlined above.  Orders Placed This Encounter  Procedures  . Comprehensive Metabolic Panel (CMET)  . TSH  . Ambulatory referral to Sleep Studies  . EKG 12-Lead  . ECHOCARDIOGRAM COMPLETE   Meds ordered this encounter  Medications  . dronedarone (MULTAQ) 400 MG tablet    Sig: Take 1 tablet (400 mg total) by mouth 2 (two) times daily with a meal.    Dispense:  60 tablet    Refill:  11     Chief Complaint  Patient presents with  . Irregular Heart Beat    Onset 3 days ago much worse/ has had this all her life, but worse this week  . Tachycardia    Pulse 145-172 at work 2 days ago  . Atrial Fibrillation    History of Present Illness:    Nicole Hays is a 51 y.o. female who is being seen today for the evaluation of atrial fibrillation after [facility]  ED visit 03/02/17 at the request of Schaevitz, Myra Rudeavid Matthew, MD   She was seen in the emergency room yesterday with atrial  fibrillation, rate was slowed with oral medication anticoagulated and discharged.  This morning she was back in sinus rhythm.  She has had previous episodes of brief rapid heart rhythm but never sustained or severe.  She takes no over-the-counter proarrhythmic drugs and there is no alcohol abuse.  She has no history of thyroid disease.  She does have family history of her mother with atrial fibrillation and stroke.  She has no history of congenital or rheumatic heart disease she had had a murmur as a child and young adult at one time was she was told she had mitral valve prolapse but a subsequent cardiac echo 20-30 years ago was normal.  She snores severely concerned she may have sleep apnea.  She does not have chest pain syncope or TIA and has mild exertional shortness of breath not severe or limiting.   Past Medical History:  Diagnosis Date  . Anxiety   . Asthma    rarely uses inhaler  . GERD (gastroesophageal reflux disease)   . IBS (irritable bowel syndrome)   . Migraines    last one years ago  . Seasonal allergies     Past Surgical History:  Procedure Laterality Date  . DILATATION & CURETTAGE/HYSTEROSCOPY WITH MYOSURE N/A 08/11/2015   Procedure: DILATATION & CURETTAGE/HYSTEROSCOPY WITH MYOSURE;  Surgeon: Jerene BearsMary S Miller, MD;  Location: [facility];  Service: Gynecology;  Laterality: N/A;  . FOOT SURGERY Bilateral    spurs  . NOVASURE ABLATION N/A 08/11/2015   Procedure: NOVASURE ABLATION;  Surgeon: Jerene Bears, MD;  Location: [facility];  Service: Gynecology;  Laterality: N/A;  . WISDOM TOOTH EXTRACTION      Current Medications: Current Meds  Medication Sig  . acetaminophen (TYLENOL) 500 MG tablet Take 500 mg by mouth every 8 (eight) hours as needed.  Marland Kitchen albuterol (PROVENTIL HFA;VENTOLIN HFA) 108 (90 Base) MCG/ACT inhaler Inhale into the lungs every 6 (six) hours as needed for wheezing or shortness of breath.  Marland Kitchen apixaban (ELIQUIS) 5 MG TABS tablet Take 1 tablet (5 mg total) by mouth 2 (two)  times daily.  . Cholecalciferol (VITAMIN D3 PO) Take 25 mcg by mouth daily.  . diazepam (VALIUM) 5 MG tablet Take 1 tablet (5 mg total) by mouth every 6 (six) hours as needed for anxiety.  . diphenoxylate-atropine (LOMOTIL) 2.5-0.025 MG per tablet Take by mouth 4 (four) times daily as needed for diarrhea or loose stools.  . Eletriptan Hydrobromide (RELPAX PO) Take 1 tablet by mouth daily as needed (For migraine.).   Marland Kitchen loratadine (CLARITIN) 10 MG tablet Take 10 mg by mouth daily.  Marland Kitchen MAGNESIUM PO Take 250 mg by mouth daily.   . Menaquinone-7 (VITAMIN K2 PO) Take 150 mcg by mouth daily.  . metoprolol tartrate (LOPRESSOR) 25 MG tablet Take 1 tablet (25 mg total) by mouth 2 (two) times daily.  . montelukast (SINGULAIR) 10 MG tablet Take 10 mg by mouth at bedtime.   . Multiple Vitamins-Minerals (EMERGEN-C IMMUNE PLUS PO) Take 1 tablet by mouth daily as needed (To prevent cold.). Reported on 10/23/2015  . Multiple Vitamins-Minerals (EMERGEN-C IMMUNE PO) Take by mouth daily as needed.  . pseudoephedrine (SUDAFED) 30 MG tablet Take 30 mg by mouth every 6 (six) hours as needed for congestion.  . ranitidine (ZANTAC) 150 MG tablet Take 150 mg by mouth daily.  . vitamin C (ASCORBIC ACID) 500 MG tablet Take 500 mg by mouth daily.  . [DISCONTINUED] aspirin 81 MG tablet Take 81 mg by mouth daily.     Allergies:   Ciprofloxacin and Penicillins   Social History   Social History  . Marital status: Married    Spouse name: N/A  . Number of children: N/A  . Years of education: N/A   Social History Main Topics  . Smoking status: Never Smoker  . Smokeless tobacco: Never Used  . Alcohol use 1.8 - 2.4 oz/week    2 Glasses of wine, 1 - 2 Standard drinks or equivalent per week  . Drug use: No  . Sexual activity: Yes    Partners: Male    Birth control/ protection: None     Comment: Husband has vasectomy   Other Topics Concern  . None   Social History Narrative  . None     Family History: The  patient's family history includes Hyperlipidemia in her father; Hypertension in her mother; Osteoporosis in her maternal grandmother; Stroke in her maternal uncle and mother.  ROS:   Review of Systems  Constitution: Negative.  HENT: Negative.   Eyes: Negative.   Cardiovascular: Positive for palpitations. Negative for chest pain, claudication, cyanosis, dyspnea on exertion, irregular heartbeat, leg swelling, near-syncope, orthopnea, paroxysmal nocturnal dyspnea and syncope.  Respiratory: Positive for cough, shortness of breath (with AF) and snoring. Negative for hemoptysis, sleep disturbances due to breathing, sputum production and wheezing.   Endocrine: Negative.   Hematologic/Lymphatic: Negative.  Skin: Negative.   Musculoskeletal: Negative.   Gastrointestinal: Positive for heartburn. Negative for bloating, abdominal pain, anorexia, change in bowel habit, bowel incontinence, constipation, diarrhea, dysphagia, excessive appetite, flatus, hematemesis, hematochezia, hemorrhoids, jaundice, melena, nausea and vomiting.  Genitourinary: Negative.   Neurological: Negative.   Psychiatric/Behavioral: Negative.   Allergic/Immunologic: Negative.    Please see the history of present illness.      EKGs/Labs/Other Studies Reviewed:    The following studies were reviewed today: Her emergency room notes labs x-ray and EKG reviewed prior to visit  EKG:  EKG 03/02/17 with rapid AF             EKG today sinus bradycardia 51 bpm otherwise normal Recent Labs: TSH normal 07/11/15 03/01/2017: ALT 14; BUN 11; Creatinine, Ser 0.86; Hemoglobin 15.5; Platelets 291; Potassium 3.0; Sodium 144  Recent Lipid Panel No results found for: CHOL, TRIG, HDL, CHOLHDL, VLDL, LDLCALC, LDLDIRECT  Physical Exam:    VS:  BP 120/90 (BP Location: Right Arm, Patient Position: Sitting, Cuff Size: Normal)   Pulse (!) 54   Ht 5\' 3"  (1.6 m)   Wt 182 lb (82.6 kg)   SpO2 94%   BMI 32.24 kg/m     Wt Readings from Last 3  Encounters:  03/03/17 182 lb (82.6 kg)  03/01/17 190 lb (86.2 kg)  08/24/16 182 lb (82.6 kg)     GEN:  Well nourished, well developed in no acute distress HEENT: Normal NECK: No JVD; No carotid bruits LYMPHATICS: No lymphadenopathy CARDIAC: RRR, no murmurs, rubs, gallops RESPIRATORY:  Clear to auscultation without rales, wheezing or rhonchi  ABDOMEN: Soft, non-tender, non-distended MUSCULOSKELETAL:  No edema; No deformity  SKIN: Warm and dry NEUROLOGIC:  Alert and oriented x 3 PSYCHIATRIC:  Normal affect     Signed, Norman Herrlich, MD  03/03/2017 12:39 PM    [facility]

## 2017-03-03 ENCOUNTER — Encounter: Payer: Self-pay | Admitting: Cardiology

## 2017-03-03 ENCOUNTER — Ambulatory Visit (INDEPENDENT_AMBULATORY_CARE_PROVIDER_SITE_OTHER): Payer: BC Managed Care – PPO | Admitting: Cardiology

## 2017-03-03 VITALS — BP 120/90 | HR 54 | Ht 63.0 in | Wt 182.0 lb

## 2017-03-03 DIAGNOSIS — I480 Paroxysmal atrial fibrillation: Secondary | ICD-10-CM | POA: Diagnosis not present

## 2017-03-03 DIAGNOSIS — R0683 Snoring: Secondary | ICD-10-CM | POA: Diagnosis not present

## 2017-03-03 MED ORDER — DRONEDARONE HCL 400 MG PO TABS: 400.0000 mg | ORAL_TABLET | Freq: Two times a day (BID) | ORAL | 11 refills | Status: DC

## 2017-03-03 NOTE — Patient Instructions (Addendum)
Medication Instructions:  Your physician has recommended you make the following change in your medication:  STOP aspirin START drodenarone (Multaq) 400 mg twice daily  Labwork: Your physician recommends that you return for lab work in: today. TSH, CMP  Testing/Procedures: You had an EKG today.  You will return for an EKG in 2 weeks.  Your physician has requested that you have an echocardiogram. Echocardiography is a painless test that uses sound waves to create images of your heart. It provides your doctor with information about the size and shape of your heart and how well your heart's chambers and valves are working. This procedure takes approximately one hour. There are no restrictions for this procedure.  Your physician has recommended that you have a sleep study. This test records several body functions during sleep, including: brain activity, eye movement, oxygen and carbon dioxide blood levels, heart rate and rhythm, breathing rate and rhythm, the flow of air through your mouth and nose, snoring, body muscle movements, and chest and belly movement.  Follow-Up: Your physician recommends that you schedule a follow-up appointment in: 4 weeks.  A referral has been placed for the sleep study. You will receive a phone call to schedule.  Any Other Special Instructions Will Be Listed Below (If Applicable).     If you need a refill on your cardiac medications before your next appointment, please call your pharmacy.    1. Avoid all over-the-counter antihistamines except Claritin/Loratadine and Zyrtec/Cetrizine. 2. Avoid all combination including cold sinus allergies flu decongestant and sleep medications 3. You can use Robitussin DM Mucinex and Mucinex DM for cough. 4. can use Tylenol aspirin ibuprofen and naproxen but no combinations such as sleep or sinus.  KardiaMobile [email or web address]        FDA-cleared, clinical grade mobile EKG monitor:  Lourena SimmondsKardia is the most clinically-validated mobile EKG used by the world's leading cardiac care medical professionals With Basic service, know instantly if your heart rhythm is normal or if atrial fibrillation is detected, and email the last single EKG recording to yourself or your doctor Premium service, available for purchase through the Kardia app for $9.99 per month or $99 per year, includes unlimited history and storage of your EKG recordings, a monthly EKG summary report to share with your doctor, along with the ability to track your blood pressure, activity and weight Includes one KardiaMobile phone clip FREE SHIPPING: Standard delivery 1-3 business days. Orders placed by 11:00am PST will ship that afternoon. Otherwise, will ship next business day. All orders ship via [employer] Priority Mail from [place], [place]

## 2017-03-04 LAB — COMPREHENSIVE METABOLIC PANEL
ALT: 9 IU/L (ref 0–32)
AST: 12 IU/L (ref 0–40)
Albumin/Globulin Ratio: 1.7 (ref 1.2–2.2)
Albumin: 4.3 g/dL (ref 3.5–5.5)
Alkaline Phosphatase: 64 IU/L (ref 39–117)
BILIRUBIN TOTAL: 0.2 mg/dL (ref 0.0–1.2)
BUN/Creatinine Ratio: 15 (ref 9–23)
BUN: 11 mg/dL (ref 6–24)
CALCIUM: 9.7 mg/dL (ref 8.7–10.2)
CHLORIDE: 102 mmol/L (ref 96–106)
CO2: 29 mmol/L (ref 20–29)
Creatinine, Ser: 0.71 mg/dL (ref 0.57–1.00)
GFR, EST AFRICAN AMERICAN: 116 mL/min/{1.73_m2} (ref >59)
GFR, EST NON AFRICAN AMERICAN: 100 mL/min/{1.73_m2} (ref >59)
GLUCOSE: 84 mg/dL (ref 65–99)
Globulin, Total: 2.5 g/dL (ref 1.5–4.5)
POTASSIUM: 3.7 mmol/L (ref 3.5–5.2)
Sodium: 144 mmol/L (ref 134–144)
TOTAL PROTEIN: 6.8 g/dL (ref 6.0–8.5)

## 2017-03-04 LAB — TSH: TSH: 1.19 u[IU]/mL (ref 0.450–4.500)

## 2017-03-17 ENCOUNTER — Ambulatory Visit: Payer: BC Managed Care – PPO

## 2017-03-17 ENCOUNTER — Ambulatory Visit (INDEPENDENT_AMBULATORY_CARE_PROVIDER_SITE_OTHER): Payer: BC Managed Care – PPO | Admitting: Cardiology

## 2017-03-17 DIAGNOSIS — I4819 Other persistent atrial fibrillation: Secondary | ICD-10-CM

## 2017-03-17 DIAGNOSIS — I481 Persistent atrial fibrillation: Secondary | ICD-10-CM

## 2017-03-17 NOTE — Progress Notes (Signed)
Patient arrived today for an EKG 2 weeks after starting Multaq. Patient has had no complaints since starting the medication except more constipation than usual with her IBS. Dr. Dulce SellarMunley reviewed EKG, advised to continue Multaq. Dr. Dulce SellarMunley also advised that Mirilax would be safe for the patient to take for constipation. Patient verbalized understanding. No further questions.

## 2017-03-29 ENCOUNTER — Ambulatory Visit (HOSPITAL_BASED_OUTPATIENT_CLINIC_OR_DEPARTMENT_OTHER)
Admission: RE | Admit: 2017-03-29 | Discharge: 2017-03-29 | Disposition: A | Discharge status: Home / Self Care | Payer: BC Managed Care – PPO | Source: Ambulatory Visit | Attending: Cardiology | Admitting: Cardiology

## 2017-03-29 DIAGNOSIS — I2510 Atherosclerotic heart disease of native coronary artery without angina pectoris: Secondary | ICD-10-CM | POA: Insufficient documentation

## 2017-03-29 DIAGNOSIS — I480 Paroxysmal atrial fibrillation: Secondary | ICD-10-CM

## 2017-03-29 NOTE — Progress Notes (Signed)
Echocardiogram 2D Echocardiogram has been performed.  Nicole Hays, Nicole Hays 03/29/2017, 4:04 PM

## 2017-03-30 DIAGNOSIS — Z79899 Other long term (current) drug therapy: Secondary | ICD-10-CM | POA: Insufficient documentation

## 2017-03-30 NOTE — Progress Notes (Signed)
Cardiology Office Note:    Date:  03/31/2017   ID:  Nicole Hays, DOB 1965-09-10, MRN [id number]  PCP:  Juluis RainierBarnes, Elizabeth, MD  Cardiologist:  Norman HerrlichBrian Munley, MD    Referring MD: Juluis RainierBarnes, Elizabeth, MD    ASSESSMENT:    1. Paroxysmal atrial fibrillation (HCC)   2. High risk medication use    PLAN:    In order of problems listed above:  1. Stable, she has had no clinical or EKG recurrence of atrial fibrillation we will continue Multaq decrease the dose of her beta-blocker with cough and with low risk of stroke will discontinue her anticoagulant and resume aspirin 81 mg daily.  She is committed to weight loss and is scheduled for an evaluation for sleep apnea and will continue on antiarrhythmic drug until I see her back in 6 months.  She will screen her heart rate at home with the device cough and is considering getting an adapter for her smart phone for heart rhythm evaluation.  She will continue to avoid over-the-counter proarrhythmic agents. 2. Stable no evidence of toxicity recent labs and EKG and if she remains on Multaq she will require CMP is every 6 months.   Next appointment: 6 months   Medication Adjustments/Labs and Tests Ordered: Current medicines are reviewed at length with the patient today.  Concerns regarding medicines are outlined above.  No orders of the defined types were placed in this encounter.  No orders of the defined types were placed in this encounter.   No chief complaint on file.   History of Present Illness:    Nicole Hays is a 51 y.o. female with a hx of paroxysmal atrial fibrillation last seen 4 weeks ago and initiated on Multaq. Compliance with diet, lifestyle and medications: Yes She has had infrequent isolated palpitation not severe sustained and no recurrence of atrial fibrillation.  She is involved in diet has lost weight and is arrange for outpatient sleep apnea evaluation.  She recently has had a cough wonders if she wheezes and  will decrease the dose of her beta-blocker.  If unimproved I will stop.  She avoids over-the-counter proarrhythmic drugs Past Medical History:  Diagnosis Date  . Anxiety   . Asthma    rarely uses inhaler  . GERD (gastroesophageal reflux disease)   . IBS (irritable bowel syndrome)   . Migraines    last one years ago  . Seasonal allergies     Past Surgical History:  Procedure Laterality Date  . DILATATION & CURETTAGE/HYSTEROSCOPY WITH MYOSURE N/A 08/11/2015   Procedure: DILATATION & CURETTAGE/HYSTEROSCOPY WITH MYOSURE;  Surgeon: Jerene BearsMary S Miller, MD;  Location: [facility];  Service: Gynecology;  Laterality: N/A;  . FOOT SURGERY Bilateral    spurs  . NOVASURE ABLATION N/A 08/11/2015   Procedure: NOVASURE ABLATION;  Surgeon: Jerene BearsMary S Miller, MD;  Location: [facility];  Service: Gynecology;  Laterality: N/A;  . WISDOM TOOTH EXTRACTION      Current Medications: No outpatient medications have been marked as taking for the 03/31/17 encounter (Appointment) with Baldo DaubMunley, Brian J, MD.     Allergies:   Ciprofloxacin and Penicillins   Social History   Socioeconomic History  . Marital status: Married    Spouse name: Not on file  . Number of children: Not on file  . Years of education: Not on file  . Highest education level: Not on file  Social Needs  . Financial resource strain: Not on file  . Food insecurity - worry: Not on  file  . Food insecurity - inability: Not on file  . Transportation needs - medical: Not on file  . Transportation needs - non-medical: Not on file  Occupational History  . Not on file  Tobacco Use  . Smoking status: Never Smoker  . Smokeless tobacco: Never Used  Substance and Sexual Activity  . Alcohol use: Yes    Alcohol/week: 1.8 - 2.4 oz    Types: 2 Glasses of wine, 1 - 2 Standard drinks or equivalent per week  . Drug use: No  . Sexual activity: Yes    Partners: Male    Birth control/protection: None    Comment: Husband has vasectomy  Other Topics Concern  . Not  on file  Social History Narrative  . Not on file     Family History: The patient's family history includes Hyperlipidemia in her father; Hypertension in her mother; Osteoporosis in her maternal grandmother; Stroke in her maternal uncle and mother. ROS:   Please see the history of present illness.    All other systems reviewed and are negative.  EKGs/Labs/Other Studies Reviewed:    The following studies were reviewed today:  EKG:  EKG 03/17/17 [facility] normal QTc  Echo: Study Conclusions - Left ventricle: The cavity size was normal. Systolic function was   normal. The estimated ejection fraction was in the range of 55%   to 65%. Wall motion was normal; there were no regional wall   motion abnormalities. Left ventricular diastolic function   parameters were normal. - Aortic valve: Valve area (VTI): 2.35 cm^2. Valve area (Vmax):   2.47 cm^2. Valve area (Vmean): 2.41 cm^2. Impressions: - Normal LVEF and LA size   Normal Diastolic function.   Trace MR. Recent Labs: 03/01/2017: Hemoglobin 15.5; Platelets 291 03/03/2017: ALT 9; BUN 11; Creatinine, Ser 0.71; Potassium 3.7; Sodium 144; TSH 1.190  Recent Lipid Panel No results found for: CHOL, TRIG, HDL, CHOLHDL, VLDL, LDLCALC, LDLDIRECT  Physical Exam:    VS:  There were no vitals taken for this visit.    Wt Readings from Last 3 Encounters:  03/03/17 182 lb (82.6 kg)  03/01/17 190 lb (86.2 kg)  08/24/16 182 lb (82.6 kg)     GEN:  Well nourished, well developed in no acute distress HEENT: Normal NECK: No JVD; No carotid bruits LYMPHATICS: No lymphadenopathy CARDIAC: RRR, no murmurs, rubs, gallops RESPIRATORY:  Clear to auscultation without rales, wheezing or rhonchi  ABDOMEN: Soft, non-tender, non-distended MUSCULOSKELETAL:  No edema; No deformity  SKIN: Warm and dry NEUROLOGIC:  Alert and oriented x 3 PSYCHIATRIC:  Normal affect    Signed, Norman HerrlichBrian Munley, MD  03/31/2017 9:05 AM    [facility]

## 2017-03-31 ENCOUNTER — Encounter: Payer: Self-pay | Admitting: Cardiology

## 2017-03-31 ENCOUNTER — Ambulatory Visit: Payer: BC Managed Care – PPO | Admitting: Cardiology

## 2017-03-31 VITALS — BP 120/86 | HR 56 | Ht 63.0 in | Wt 183.0 lb

## 2017-03-31 DIAGNOSIS — Z79899 Other long term (current) drug therapy: Secondary | ICD-10-CM | POA: Diagnosis not present

## 2017-03-31 DIAGNOSIS — I480 Paroxysmal atrial fibrillation: Secondary | ICD-10-CM | POA: Diagnosis not present

## 2017-03-31 MED ORDER — METOPROLOL TARTRATE 25 MG PO TABS: 25.0000 mg | ORAL_TABLET | Freq: Every day | ORAL | 3 refills | Status: DC

## 2017-03-31 MED ORDER — ASPIRIN EC 81 MG PO TBEC: 81.0000 mg | DELAYED_RELEASE_TABLET | Freq: Every day | ORAL | 1 refills | Status: DC

## 2017-03-31 MED ORDER — DRONEDARONE HCL 400 MG PO TABS: 400.0000 mg | ORAL_TABLET | Freq: Two times a day (BID) | ORAL | 3 refills | Status: DC

## 2017-03-31 MED ORDER — METOPROLOL TARTRATE 25 MG PO TABS: 25.0000 mg | ORAL_TABLET | Freq: Every day | ORAL | 0 refills | Status: DC

## 2017-03-31 NOTE — Patient Instructions (Addendum)
Medication Instructions:  Your physician has recommended you make the following change in your medication:  STOP eliquis START aspirin EC 81 mg daily  Labwork: None  Testing/Procedures: None  Follow-Up: Your physician wants you to follow-up in: 6 months. You will receive a reminder letter in the mail two months in advance. If you don't receive a letter, please call our office to schedule the follow-up appointment.  Any Other Special Instructions Will Be Listed Below (If Applicable).     If you need a refill on your cardiac medications before your next appointment, please call your pharmacy.    KardiaMobile [email or web address]        FDA-cleared, clinical grade mobile EKG monitor: Lourena SimmondsKardia is the most clinically-validated mobile EKG used by the world's leading cardiac care medical professionals With Basic service, know instantly if your heart rhythm is normal or if atrial fibrillation is detected, and email the last single EKG recording to yourself or your doctor Premium service, available for purchase through the Kardia app for $9.99 per month or $99 per year, includes unlimited history and storage of your EKG recordings, a monthly EKG summary report to share with your doctor, along with the ability to track your blood pressure, activity and weight Includes one KardiaMobile phone clip FREE SHIPPING: Standard delivery 1-3 business days. Orders placed by 11:00am PST will ship that afternoon. Otherwise, will ship next business day. All orders ship via [employer] Priority Mail from [place], [place]

## 2017-05-10 ENCOUNTER — Institutional Professional Consult (permissible substitution): Payer: BC Managed Care – PPO | Admitting: Neurology

## 2017-06-30 ENCOUNTER — Institutional Professional Consult (permissible substitution): Payer: BC Managed Care – PPO | Admitting: Neurology

## 2017-07-01 ENCOUNTER — Telehealth: Payer: Self-pay | Admitting: Cardiology

## 2017-07-01 MED ORDER — DRONEDARONE HCL 400 MG PO TABS: 400.0000 mg | ORAL_TABLET | Freq: Two times a day (BID) | ORAL | 3 refills | Status: DC

## 2017-07-01 NOTE — Telephone Encounter (Signed)
[facility] [place]

## 2017-07-01 NOTE — Telephone Encounter (Signed)
Refill sent.

## 2017-07-04 ENCOUNTER — Telehealth: Payer: Self-pay | Admitting: Cardiology

## 2017-07-04 ENCOUNTER — Ambulatory Visit (INDEPENDENT_AMBULATORY_CARE_PROVIDER_SITE_OTHER): Payer: BC Managed Care – PPO

## 2017-07-04 DIAGNOSIS — I480 Paroxysmal atrial fibrillation: Secondary | ICD-10-CM | POA: Diagnosis not present

## 2017-07-04 NOTE — Telephone Encounter (Signed)
Had an episode of afib last night and not sure if she's still in it

## 2017-07-04 NOTE — Progress Notes (Signed)
Pt came into the office today for an EKG after having an episode of irregular heart rate last night.  She started having c/o palpitations at 10pm and it lasted until 3 am.  Pt states she is under a lot of stress recently.  Pt does not have any complaints at the time of the visit.   EKG was done.  Dr Tomie Chinaevankar reviewed EKG.  Dr Dulce SellarMunley was called with normal EKG results per Dr Tomie Chinaevankar.   Dr Dulce SellarMunley advised pt to continue current medications and keep flup appts as planned.  Pt verbalized understanding.

## 2017-07-04 NOTE — Telephone Encounter (Signed)
Patient coming in for EKG at 1:30 today.

## 2017-07-04 NOTE — Telephone Encounter (Signed)
States she felt her heart racing and having palpitations right after taking her multaq and singular last night around 10 pm and it stopped around 3am. Patient states she did not check her heart rate. Symptoms have resolved. Please advise.

## 2017-07-04 NOTE — Telephone Encounter (Signed)
Do an office EKG today

## 2017-08-23 ENCOUNTER — Encounter: Payer: Self-pay | Admitting: Neurology

## 2017-08-23 ENCOUNTER — Ambulatory Visit: Payer: BC Managed Care – PPO | Admitting: Neurology

## 2017-08-23 VITALS — BP 141/79 | HR 56 | Ht 63.0 in | Wt 185.0 lb

## 2017-08-23 DIAGNOSIS — J4520 Mild intermittent asthma, uncomplicated: Secondary | ICD-10-CM | POA: Diagnosis not present

## 2017-08-23 DIAGNOSIS — R351 Nocturia: Secondary | ICD-10-CM | POA: Diagnosis not present

## 2017-08-23 DIAGNOSIS — R51 Headache: Secondary | ICD-10-CM

## 2017-08-23 DIAGNOSIS — R0683 Snoring: Secondary | ICD-10-CM | POA: Diagnosis not present

## 2017-08-23 DIAGNOSIS — R519 Headache, unspecified: Secondary | ICD-10-CM

## 2017-08-23 DIAGNOSIS — I480 Paroxysmal atrial fibrillation: Secondary | ICD-10-CM

## 2017-08-23 DIAGNOSIS — E669 Obesity, unspecified: Secondary | ICD-10-CM

## 2017-08-23 NOTE — Progress Notes (Signed)
Subjective:    Patient ID: Nicole Hays is a 52 y.o. female.  HPI     Nicole Foley, MD, PhD [facility] [address] [address] [place], [place] [address]  Dear Dr. Dulce Sellar,   I saw your patient, Nicole Hays, upon your kind request, in my neurologic clinic today for initial consultation of her sleep disorder, in particular, concern for underlying obstructive sleep apnea. The patient is unaccompanied today. As you know, Nicole Hays is a 52 year old right-handed woman with an underlying medical history of migraines, IBS, reflux disease, anxiety, asthma, seasonal allergies, obesity and new onset paroxysmal A. fib, who reports snoring and sleep disruption. She has RLS symptoms, she is restless at times, some leg cramps. She has woken up with a gasp in the past. She has occasional AM HAs, has nocturia, about 2-3 times per average night.   I reviewed your office note from 03/03/2017. Her Epworth sleepiness score is 5 out of 24, fatigue score is 25 out of 63. She is married and lives with her husband. They have 2 children. She is a nonsmoker, drinks alcohol in the form of wine, 4-5 glasses per week on average, caffeine in the form of coffee, 2-3 cups a day on average. 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 Bedtime is around 10 PM, rise time around 5:30 or 6 AM. She has a family history of OSA in her father who uses a CPAP machine. She has been trying to lose weight. She has reduced her caffeine intake and is working towards going decaff.  Her Past Medical History Is Significant For: Past Medical History:  Diagnosis Date  . Anxiety   . Asthma    rarely uses inhaler  . GERD (gastroesophageal reflux disease)   . IBS (irritable bowel syndrome)   . Migraines    last one years ago  . Seasonal allergies     Her Past Surgical History Is Significant For: Past Surgical History:  Procedure Laterality Date  . DILATATION & CURETTAGE/HYSTEROSCOPY WITH MYOSURE N/A  08/11/2015   Procedure: DILATATION & CURETTAGE/HYSTEROSCOPY WITH MYOSURE;  Surgeon: Jerene Bears, MD;  Location: [facility];  Service: Gynecology;  Laterality: N/A;  . FOOT SURGERY Bilateral    spurs  . NOVASURE ABLATION N/A 08/11/2015   Procedure: NOVASURE ABLATION;  Surgeon: Jerene Bears, MD;  Location: [facility];  Service: Gynecology;  Laterality: N/A;  . WISDOM TOOTH EXTRACTION      Her Family History Is Significant For: Family History  Problem Relation Age of Onset  . Osteoporosis Maternal Grandmother   . Hypertension Mother   . Stroke Mother   . Hyperlipidemia Father   . Stroke Maternal Uncle     Her Social History Is Significant For: Social History   Socioeconomic History  . Marital status: Married    Spouse name: Not on file  . Number of children: Not on file  . Years of education: Not on file  . Highest education level: Not on file  Occupational History  . Not on file  Social Needs  . Financial resource strain: Not on file  . Food insecurity:    Worry: Not on file    Inability: Not on file  . Transportation needs:    Medical: Not on file    Non-medical: Not on file  Tobacco Use  . Smoking status: Never Smoker  . Smokeless tobacco: Never Used  Substance and Sexual Activity  . Alcohol use: Yes    Alcohol/week:  1.8 - 2.4 oz    Types: 2 Glasses of wine, 1 - 2 Standard drinks or equivalent per week  . Drug use: No  . Sexual activity: Yes    Partners: Male    Birth control/protection: None    Comment: Husband has vasectomy  Lifestyle  . Physical activity:    Days per week: Not on file    Minutes per session: Not on file  . Stress: Not on file  Relationships  . Social connections:    Talks on phone: Not on file    Gets together: Not on file    Attends religious service: Not on file    Active member of club or organization: Not on file    Attends meetings of clubs or organizations: Not on file    Relationship status: Not on file  Other Topics Concern  . Not  on file  Social History Narrative  . Not on file    Her Allergies Are:  Allergies  Allergen Reactions  . Ciprofloxacin Nausea And Vomiting  . Penicillins     Unknown reaction as a child  :   Her Current Medications Are:  Outpatient Encounter Medications as of 08/23/2017  Medication Sig  . acetaminophen (TYLENOL) 500 MG tablet Take 500 mg by mouth every 8 (eight) hours as needed.  Marland Kitchen albuterol (PROVENTIL HFA;VENTOLIN HFA) 108 (90 Base) MCG/ACT inhaler Inhale into the lungs every 6 (six) hours as needed for wheezing or shortness of breath.  Marland Kitchen aspirin EC 81 MG tablet Take 1 tablet (81 mg total) by mouth daily.  . Cholecalciferol (VITAMIN D3 PO) Take 25 mcg by mouth daily.  . diazepam (VALIUM) 5 MG tablet Take 1 tablet (5 mg total) by mouth every 6 (six) hours as needed for anxiety.  . diphenoxylate-atropine (LOMOTIL) 2.5-0.025 MG per tablet Take by mouth 4 (four) times daily as needed for diarrhea or loose stools.  . dronedarone (MULTAQ) 400 MG tablet Take 1 tablet (400 mg total) by mouth 2 (two) times daily with a meal.  . Eletriptan Hydrobromide (RELPAX PO) Take 1 tablet by mouth daily as needed (For migraine.).   Marland Kitchen loratadine (CLARITIN) 10 MG tablet Take 10 mg by mouth daily.  Marland Kitchen MAGNESIUM PO Take 250 mg by mouth daily.   . Menaquinone-7 (VITAMIN K2 PO) Take 150 mcg by mouth daily.  . montelukast (SINGULAIR) 10 MG tablet Take 10 mg by mouth at bedtime.   . Multiple Vitamins-Minerals (EMERGEN-C IMMUNE PLUS PO) Take 1 tablet by mouth daily as needed (To prevent cold.). Reported on 10/23/2015  . ranitidine (ZANTAC) 150 MG tablet Take 150 mg by mouth daily.  . vitamin C (ASCORBIC ACID) 500 MG tablet Take 500 mg by mouth daily.  . metoprolol tartrate (LOPRESSOR) 25 MG tablet Take 1 tablet (25 mg total) by mouth daily for 1 dose.   No facility-administered encounter medications on file as of 08/23/2017.   :  Review of Systems:  Out of a complete 14 point review of systems, all are reviewed  and negative with the exception of these symptoms as listed below: Review of Systems  Neurological:       Pt presents today to discuss her sleep. Pt has afib. Pt has never had a sleep study but does endorse snoring.  Epworth Sleepiness Scale 0= would never doze 1= slight chance of dozing 2= moderate chance of dozing 3= high chance of dozing  Sitting and reading: 1 Watching TV: 1 Sitting inactive in a public place (ex.  Theater or meeting): 0 As a passenger in a car for an hour without a break: 1 Lying down to rest in the afternoon: 2 Sitting and talking to someone: 0 Sitting quietly after lunch (no alcohol): 0 In a car, while stopped in traffic: 0 Total: 5     Objective:  Neurological Exam  Physical Exam Physical Examination:   Vitals:   08/23/17 0849  BP: (!) 141/79  Pulse: (!) 56   General Examination: The patient is a very pleasant 52 y.o. female in no acute distress. She appears well-developed and well-nourished and well groomed.   HEENT: Normocephalic, atraumatic, pupils are equal, round and reactive to light and accommodation. Extraocular tracking is good without limitation to gaze excursion or nystagmus noted. Normal smooth pursuit is noted. Hearing is grossly intact. Face is symmetric with normal facial animation and normal facial sensation. Speech is clear with no dysarthria noted. There is no hypophonia. There is no lip, neck/head, jaw or voice tremor. Neck is supple with full range of passive and active motion. There are no carotid bruits on auscultation. Oropharynx exam reveals: mild mouth dryness, good dental hygiene and mild airway crowding, due to smaller airway entry and redundant soft palate. Mallampati is class III. Tongue protrudes centrally and palate elevates symmetrically. Tonsils are small or even absent. Neck size is 14.5 inches. She has a Mild overbite. Nasal inspection reveals no significant nasal mucosal bogginess or redness and no septal deviation.    Chest: Clear to auscultation without wheezing, rhonchi or crackles noted.  Heart: S1+S2+0, regular and normal without murmurs, rubs or gallops noted.   Abdomen: Soft, non-tender and non-distended with normal bowel sounds appreciated on auscultation.  Extremities: There is no pitting edema in the distal lower extremities bilaterally. Pedal pulses are intact.  Skin: Warm and dry without trophic changes noted.  Musculoskeletal: exam reveals no obvious joint deformities, tenderness or joint swelling or erythema.   Neurologically:  Mental status: The patient is awake, alert and oriented in all 4 spheres. Her immediate and remote memory, attention, language skills and fund of knowledge are appropriate. There is no evidence of aphasia, agnosia, apraxia or anomia. Speech is clear with normal prosody and enunciation. Thought process is linear. Mood is normal and affect is normal.  Cranial nerves II - XII are as described above under HEENT exam. In addition: shoulder shrug is normal with equal shoulder height noted. Motor exam: Normal bulk, strength and tone is noted. There is no drift, tremor or rebound. Romberg is negative. Reflexes are 2+ throughout. Fine motor skills and coordination: intact with normal finger taps, normal hand movements, normal rapid alternating patting, normal foot taps and normal foot agility.  Cerebellar testing: No dysmetria or intention tremor on finger to nose testing. Heel to shin is unremarkable bilaterally. There is no truncal or gait ataxia.  Sensory exam: intact to light touch in the upper and lower extremities.  Gait, station and balance: She stands easily. No veering to one side is noted. No leaning to one side is noted. Posture is age-appropriate and stance is narrow based. Gait shows normal stride length and normal pace. No problems turning are noted. Tandem walk is unremarkable. Intact toe and heel stance is noted.               Assessment and Plan:  In summary,  Windy Dudek Jessie is a very pleasant 52 y.o.-year old female with an underlying medical history of migraines, IBS, reflux disease, anxiety, asthma, seasonal allergies, obesity and  new onset paroxysmal A. fib, whose history and physical exam are concerning for obstructive sleep apnea (OSA). I had a long chat with the patient about my findings and the diagnosis of OSA, its prognosis and treatment options. We talked about medical treatments, surgical interventions and non-pharmacological approaches. I explained in particular the risks and ramifications of untreated moderate to severe OSA, especially with respect to developing cardiovascular disease down the Road, including congestive heart failure, difficult to treat hypertension, cardiac arrhythmias, or stroke. Even type 2 diabetes has, in part, been linked to untreated OSA. Symptoms of untreated OSA include daytime sleepiness, memory problems, mood irritability and mood disorder such as depression and anxiety, lack of energy, as well as recurrent headaches, especially morning headaches. We talked about trying to maintain a healthy lifestyle in general, as well as the importance of weight control. I encouraged the patient to eat healthy, exercise daily and keep well hydrated, to keep a scheduled bedtime and wake time routine, to not skip any meals and eat healthy snacks in between meals. I advised the patient not to drive when feeling sleepy. I recommended the following at this time: sleep study with potential positive airway pressure titration. (We will score hypopneas at 3% and split the sleep study into diagnostic and treatment portion, if the estimated. 2 hour AHI is >20/h).   I explained the sleep test procedure to the patient and also outlined possible surgical and non-surgical treatment options of OSA, including the use of a custom-made dental device (which would require a referral to a specialist dentist or oral surgeon), upper airway surgical options,  such as pillar implants, radiofrequency surgery, tongue base surgery, and UPPP (which would involve a referral to an ENT surgeon). Rarely, jaw surgery such as mandibular advancement may be considered.  I also explained the CPAP treatment option to the patient, who indicated that she would be willing to try CPAP if the need arises. I explained the importance of being compliant with PAP treatment, not only for insurance purposes but primarily to improve Her symptoms, and for the patient's long term health benefit, including to reduce Her cardiovascular risks. I answered all her questions today and the patient was in agreement. I would like to see her back after the sleep study is completed and encouraged her to call with any interim questions, concerns, problems or updates.   Thank you very much for allowing me to participate in the care of this nice patient. If I can be of any further assistance to you please do not hesitate to call me at [phone].  Sincerely,   Nicole FoleySaima Desmin Daleo, MD, PhD

## 2017-08-23 NOTE — Patient Instructions (Addendum)

## 2017-09-28 ENCOUNTER — Telehealth: Payer: Self-pay

## 2017-09-28 NOTE — Telephone Encounter (Signed)
We have attempted to call the patient two times to schedule sleep study.  Patient has been unavailable at the phone numbers we have on file and has not returned our calls.  At this point we will send a letter asking patient to please contact the sleep lab to schedule their sleep study.  If patient calls back we will schedule them for their sleep study. 

## 2017-10-14 ENCOUNTER — Other Ambulatory Visit: Payer: Self-pay | Admitting: Obstetrics & Gynecology

## 2017-10-14 DIAGNOSIS — Z1231 Encounter for screening mammogram for malignant neoplasm of breast: Secondary | ICD-10-CM

## 2017-11-14 ENCOUNTER — Ambulatory Visit
Admission: RE | Admit: 2017-11-14 | Discharge: 2017-11-14 | Disposition: A | Discharge status: Home / Self Care | Payer: BC Managed Care – PPO | Source: Ambulatory Visit | Attending: Obstetrics & Gynecology | Admitting: Obstetrics & Gynecology

## 2017-11-14 DIAGNOSIS — Z1231 Encounter for screening mammogram for malignant neoplasm of breast: Secondary | ICD-10-CM

## 2017-11-18 ENCOUNTER — Ambulatory Visit: Payer: BC Managed Care – PPO | Admitting: Obstetrics & Gynecology

## 2017-11-18 ENCOUNTER — Encounter: Payer: Self-pay | Admitting: Obstetrics & Gynecology

## 2017-11-18 VITALS — BP 110/64 | HR 64 | Resp 14 | Ht 62.5 in | Wt 189.0 lb

## 2017-11-18 DIAGNOSIS — Z01419 Encounter for gynecological examination (general) (routine) without abnormal findings: Secondary | ICD-10-CM

## 2017-11-18 NOTE — Progress Notes (Signed)
52 y.o. [id number] MarriedCaucasianF here for annual exam.  Doing well but was diagnosed with afib this last year.  Now seeing Dr. Dulce Sellar.  Did go to the ER.  Was on Eliquis for about a month.  Now on metoprolol and 81mg  ASA and Multaq.  Had echo.    Denies vaginal bleeding except for a small amount of spotting.  Having some hot flashes.  Does have a little urinary leakage with coughing.  PCP:  Dr. Zachery Dauer.  Blood work was done in April.  Lipids were normal.  Patient's last menstrual period was 05/22/2015.          Sexually active: Yes.    The current method of family planning is vasectomy.    Exercising: Yes.    walking and fitness board  Smoker:  no  Health Maintenance: Pap:  08/24/16 neg. HR HPV:neg History of abnormal Pap:  no MMG:  11/15/17 BIRADS1:neg Colonoscopy:  6/14, due this year.  Pt had scheduled but rescheduled due to change in husband's job BMD:   No  TDaP:  2013 Pneumonia vaccine(s):  Not done Shingrix:   no Hep C testing: no Screening Labs: 4/19 with Dr. Zachery Dauer   reports that she has never smoked. She has never used smokeless tobacco. She reports that she drinks about 1.8 - 2.4 oz of alcohol per week. She reports that she does not use drugs.  Past Medical History:  Diagnosis Date  . Anxiety   . Asthma    rarely uses inhaler  . GERD (gastroesophageal reflux disease)   . IBS (irritable bowel syndrome)   . Migraines    last one years ago  . Seasonal allergies   . Shingles 1995    Past Surgical History:  Procedure Laterality Date  . DILATATION & CURETTAGE/HYSTEROSCOPY WITH MYOSURE N/A 08/11/2015   Procedure: DILATATION & CURETTAGE/HYSTEROSCOPY WITH MYOSURE;  Surgeon: Jerene Bears, MD;  Location: [facility];  Service: Gynecology;  Laterality: N/A;  . FOOT SURGERY Bilateral    spurs  . NOVASURE ABLATION N/A 08/11/2015   Procedure: NOVASURE ABLATION;  Surgeon: Jerene Bears, MD;  Location: [facility];  Service: Gynecology;  Laterality: N/A;  . WISDOM TOOTH EXTRACTION       Current Outpatient Medications  Medication Sig Dispense Refill  . acetaminophen (TYLENOL) 500 MG tablet Take 500 mg by mouth every 8 (eight) hours as needed.    Marland Kitchen albuterol (PROVENTIL HFA;VENTOLIN HFA) 108 (90 Base) MCG/ACT inhaler Inhale into the lungs every 6 (six) hours as needed for wheezing or shortness of breath.    Marland Kitchen aspirin EC 81 MG tablet Take 1 tablet (81 mg total) by mouth daily. 30 tablet 1  . Cholecalciferol (VITAMIN D3 PO) Take 25 mcg by mouth daily.    . diphenoxylate-atropine (LOMOTIL) 2.5-0.025 MG per tablet Take by mouth 4 (four) times daily as needed for diarrhea or loose stools.    . dronedarone (MULTAQ) 400 MG tablet Take 1 tablet (400 mg total) by mouth 2 (two) times daily with a meal. 180 tablet 3  . Eletriptan Hydrobromide (RELPAX PO) Take 1 tablet by mouth daily as needed (For migraine.).     Marland Kitchen loratadine (CLARITIN) 10 MG tablet Take 10 mg by mouth daily.    Marland Kitchen MAGNESIUM PO Take 250 mg by mouth daily.     . Menaquinone-7 (VITAMIN K2 PO) Take 150 mcg by mouth daily.    . montelukast (SINGULAIR) 10 MG tablet Take 10 mg by mouth at bedtime.     Marland Kitchen  Multiple Vitamins-Minerals (EMERGEN-C IMMUNE PLUS PO) Take 1 tablet by mouth daily as needed (To prevent cold.). Reported on 10/23/2015    . ranitidine (ZANTAC) 150 MG tablet Take 150 mg by mouth daily.    . vitamin C (ASCORBIC ACID) 500 MG tablet Take 500 mg by mouth daily.    . diazepam (VALIUM) 5 MG tablet Take 1 tablet (5 mg total) by mouth every 6 (six) hours as needed for anxiety. (Patient not taking: Reported on 11/18/2017) 30 tablet 0  . metoprolol tartrate (LOPRESSOR) 25 MG tablet Take 1 tablet (25 mg total) by mouth daily for 1 dose. 180 tablet 3   No current facility-administered medications for this visit.     Family History  Problem Relation Age of Onset  . Osteoporosis Maternal Grandmother   . Hypertension Mother   . Stroke Mother   . Hyperlipidemia Father   . Stroke Maternal Uncle     Review of Systems   Eyes: Negative.   Respiratory: Negative.   Cardiovascular: Positive for palpitations.  Gastrointestinal: Positive for constipation.  Genitourinary: Positive for frequency.       Loss of urin with sneeze   Musculoskeletal: Positive for joint pain.  Neurological: Positive for headaches.  Endo/Heme/Allergies: Negative.   Psychiatric/Behavioral: Positive for memory loss.       Lack of coordination and quick recall that feels related to menopausal symptoms.    Exam:   BP 110/64   Pulse 64   Resp 14   Ht 5' 2.5" (1.588 m)   Wt 189 lb (85.7 kg)   LMP 05/22/2015   BMI 34.02 kg/m     Height: 5' 2.5" (158.8 cm)  Ht Readings from Last 3 Encounters:  11/18/17 5' 2.5" (1.588 m)  08/23/17 5\' 3"  (1.6 m)  03/31/17 5\' 3"  (1.6 m)    General appearance: alert, cooperative and appears stated age Head: Normocephalic, without obvious abnormality, atraumatic Neck: no adenopathy, supple, symmetrical, trachea midline and thyroid normal to inspection and palpation Lungs: clear to auscultation bilaterally Breasts: normal appearance, no masses or tenderness Heart: regular rate and rhythm Abdomen: soft, non-tender; bowel sounds normal; no masses,  no organomegaly Extremities: extremities normal, atraumatic, no cyanosis or edema Skin: Skin color, texture, turgor normal. No rashes or lesions Lymph nodes: Cervical, supraclavicular, and axillary nodes normal. No abnormal inguinal nodes palpated Neurologic: Grossly normal   Pelvic: External genitalia:  no lesions              Urethra:  normal appearing urethra with no masses, tenderness or lesions              Bartholins and Skenes: normal                 Vagina: normal appearing vagina with normal color and discharge, no lesions              Cervix: no lesions              Pap taken: No. Bimanual Exam:  Uterus:  normal size, contour, position, consistency, mobility, non-tender              Adnexa: normal adnexa and no mass, fullness, tenderness                Rectovaginal: Confirms               Anus:  normal sphincter tone, no lesions  Chaperone was present for exam.  A:  Well Woman with normal exam H/O endometrial ablation  4/17 Menopausal symptoms GERD Mildly elevated lipids Afib  P:   Mammogram guidelines reviewed.  Grade C breast density pap smear 2018, not indicated today Blood work UTD with Dr. Zachery DauerBarnes D/w pt Shingrix vaccinations return annually or prn

## 2017-11-30 ENCOUNTER — Encounter: Payer: Self-pay | Admitting: Cardiology

## 2017-11-30 ENCOUNTER — Ambulatory Visit: Payer: BC Managed Care – PPO | Admitting: Cardiology

## 2017-11-30 VITALS — BP 140/74 | HR 63 | Ht 63.0 in | Wt 190.1 lb

## 2017-11-30 DIAGNOSIS — I480 Paroxysmal atrial fibrillation: Secondary | ICD-10-CM | POA: Diagnosis not present

## 2017-11-30 DIAGNOSIS — Z79899 Other long term (current) drug therapy: Secondary | ICD-10-CM | POA: Diagnosis not present

## 2017-11-30 NOTE — Patient Instructions (Addendum)
Medication Instructions:  Your physician recommends that you continue on your current medications as directed. Please refer to the Current Medication list given to you today.   Labwork: Your physician recommends that you return for lab work today: CMP.   Testing/Procedures: None  Follow-Up: Your physician wants you to follow-up in: 9 months. You will receive a reminder letter in the mail two months in advance. If you don't receive a letter, please call our office to schedule the follow-up appointment.   If you need a refill on your cardiac medications before your next appointment, please call your pharmacy.   Thank you for choosing [facility]! Mady Gemmaatherine Lockhart, RN [phone]      1. Avoid all over-the-counter antihistamines except Claritin/Loratadine and Zyrtec/Cetrizine. 2. Avoid all combination including cold sinus allergies flu decongestant and sleep medications 3. You can use Robitussin DM Mucinex and Mucinex DM for cough. 4. can use Tylenol aspirin ibuprofen and naproxen but no combinations such as sleep or sinus.

## 2017-11-30 NOTE — Progress Notes (Signed)
Cardiology Office Note:    Date:  11/30/2017   ID:  Nicole Hays, DOB 27-Aug-1965, MRN [id number]  PCP:  Juluis RainierBarnes, Elizabeth, MD  Cardiologist:  Nicole HerrlichBrian Blaire Hodsdon, MD    Referring MD: Juluis RainierBarnes, Elizabeth, MD    ASSESSMENT:    1. Paroxysmal atrial fibrillation (HCC)   2. High risk medication use    PLAN:    In order of problems listed above:  1. Stable no clinical recurrence continue Multaq check CMP today regarding liver function screen for toxicity 2. Continue Multaq see above   Next appointment: 9 months   Medication Adjustments/Labs and Tests Ordered: Current medicines are reviewed at length with the patient today.  Concerns regarding medicines are outlined above.  Orders Placed This Encounter  Procedures  . Comprehensive Metabolic Panel (CMET)   No orders of the defined types were placed in this encounter.   Chief Complaint  Patient presents with  . Follow-up    History of Present Illness:    Nicole Hays is a 52 y.o. female with a hx of atrial fibrillation on Multaq last seen 03/31/18. Compliance with diet, lifestyle and medications: Yes  She has a smart watch with EKG monitoring is had no recurrent atrial fibrillation overall she feels well tolerates Multaq no chest pain palpitation or syncope. Past Medical History:  Diagnosis Date  . Anxiety   . Asthma    rarely uses inhaler  . GERD (gastroesophageal reflux disease)   . IBS (irritable bowel syndrome)   . Migraines    last one years ago  . Seasonal allergies   . Shingles 1995    Past Surgical History:  Procedure Laterality Date  . DILATATION & CURETTAGE/HYSTEROSCOPY WITH MYOSURE N/A 08/11/2015   Procedure: DILATATION & CURETTAGE/HYSTEROSCOPY WITH MYOSURE;  Surgeon: Jerene BearsMary S Miller, MD;  Location: [facility];  Service: Gynecology;  Laterality: N/A;  . FOOT SURGERY Bilateral    spurs  . NOVASURE ABLATION N/A 08/11/2015   Procedure: NOVASURE ABLATION;  Surgeon: Jerene BearsMary S Miller, MD;  Location: [facility];   Service: Gynecology;  Laterality: N/A;  . WISDOM TOOTH EXTRACTION      Current Medications: Current Meds  Medication Sig  . acetaminophen (TYLENOL) 500 MG tablet Take 500 mg by mouth every 8 (eight) hours as needed.  Marland Kitchen. albuterol (PROVENTIL HFA;VENTOLIN HFA) 108 (90 Base) MCG/ACT inhaler Inhale into the lungs every 6 (six) hours as needed for wheezing or shortness of breath.  Marland Kitchen. aspirin EC 81 MG tablet Take 1 tablet (81 mg total) by mouth daily.  . Cholecalciferol (VITAMIN D3 PO) Take 25 mcg by mouth daily.  . diazepam (VALIUM) 5 MG tablet Take 1 tablet (5 mg total) by mouth every 6 (six) hours as needed for anxiety.  . diphenoxylate-atropine (LOMOTIL) 2.5-0.025 MG per tablet Take by mouth 4 (four) times daily as needed for diarrhea or loose stools.  . dronedarone (MULTAQ) 400 MG tablet Take 1 tablet (400 mg total) by mouth 2 (two) times daily with a meal.  . Eletriptan Hydrobromide (RELPAX PO) Take 1 tablet by mouth daily as needed (For migraine.).   Marland Kitchen. loratadine (CLARITIN) 10 MG tablet Take 10 mg by mouth daily.  Marland Kitchen. MAGNESIUM PO Take 250 mg by mouth daily.   . Menaquinone-7 (VITAMIN K2 PO) Take 150 mcg by mouth daily.  . montelukast (SINGULAIR) 10 MG tablet Take 10 mg by mouth at bedtime.   . Multiple Vitamins-Minerals (EMERGEN-C IMMUNE PLUS PO) Take 1 tablet by mouth daily as needed (To prevent cold.).  Reported on 10/23/2015  . ranitidine (ZANTAC) 150 MG tablet Take 150 mg by mouth daily.  . vitamin C (ASCORBIC ACID) 500 MG tablet Take 500 mg by mouth daily.     Allergies:   Ciprofloxacin and Penicillins   Social History   Socioeconomic History  . Marital status: Married    Spouse name: Not on file  . Number of children: Not on file  . Years of education: Not on file  . Highest education level: Not on file  Occupational History  . Not on file  Social Needs  . Financial resource strain: Not on file  . Food insecurity:    Worry: Not on file    Inability: Not on file  .  Transportation needs:    Medical: Not on file    Non-medical: Not on file  Tobacco Use  . Smoking status: Never Smoker  . Smokeless tobacco: Never Used  Substance and Sexual Activity  . Alcohol use: Yes    Alcohol/week: 1.8 - 2.4 oz    Types: 2 Glasses of wine, 1 - 2 Standard drinks or equivalent per week  . Drug use: No  . Sexual activity: Yes    Partners: Male    Birth control/protection: None    Comment: Husband has vasectomy  Lifestyle  . Physical activity:    Days per week: Not on file    Minutes per session: Not on file  . Stress: Not on file  Relationships  . Social connections:    Talks on phone: Not on file    Gets together: Not on file    Attends religious service: Not on file    Active member of club or organization: Not on file    Attends meetings of clubs or organizations: Not on file    Relationship status: Not on file  Other Topics Concern  . Not on file  Social History Narrative  . Not on file     Family History: The patient's family history includes Hyperlipidemia in her father; Hypertension in her mother; Osteoporosis in her maternal grandmother; Stroke in her maternal uncle and mother. ROS:   Please see the history of present illness.    All other systems reviewed and are negative.  EKGs/Labs/Other Studies Reviewed:    The following studies were reviewed today:  I reviewed strips from her Kardia smart phone device sinus rhythm no atrial fibrillation  Recent Labs: 03/01/2017: Hemoglobin 15.5; Platelets 291 03/03/2017: ALT 9; BUN 11; Creatinine, Ser 0.71; Potassium 3.7; Sodium 144; TSH 1.190  Recent Lipid Panel No results found for: CHOL, TRIG, HDL, CHOLHDL, VLDL, LDLCALC, LDLDIRECT  Physical Exam:    VS:  BP 140/74   Pulse 63   Ht 5\' 3"  (1.6 m)   Wt 190 lb 1.9 oz (86.2 kg)   SpO2 97%   BMI 33.68 kg/m     Wt Readings from Last 3 Encounters:  11/30/17 190 lb 1.9 oz (86.2 kg)  11/18/17 189 lb (85.7 kg)  08/23/17 185 lb (83.9 kg)      GEN:  Well nourished, well developed in no acute distress HEENT: Normal NECK: No JVD; No carotid bruits LYMPHATICS: No lymphadenopathy CARDIAC: RRR, no murmurs, rubs, gallops RESPIRATORY:  Clear to auscultation without rales, wheezing or rhonchi  ABDOMEN: Soft, non-tender, non-distended MUSCULOSKELETAL:  No edema; No deformity  SKIN: Warm and dry NEUROLOGIC:  Alert and oriented x 3 PSYCHIATRIC:  Normal affect    Signed, Nicole Herrlich, MD  11/30/2017 4:23 PM  [facility]

## 2017-12-01 LAB — COMPREHENSIVE METABOLIC PANEL
A/G RATIO: 1.6 (ref 1.2–2.2)
ALT: 12 IU/L (ref 0–32)
AST: 20 IU/L (ref 0–40)
Albumin: 4.2 g/dL (ref 3.5–5.5)
Alkaline Phosphatase: 62 IU/L (ref 39–117)
BUN/Creatinine Ratio: 12 (ref 9–23)
BUN: 10 mg/dL (ref 6–24)
Bilirubin Total: 0.2 mg/dL (ref 0.0–1.2)
CALCIUM: 9.5 mg/dL (ref 8.7–10.2)
CO2: 24 mmol/L (ref 20–29)
CREATININE: 0.82 mg/dL (ref 0.57–1.00)
Chloride: 107 mmol/L — ABNORMAL HIGH (ref 96–106)
GFR calc non Af Amer: 84 mL/min/{1.73_m2} (ref >59)
GFR, EST AFRICAN AMERICAN: 96 mL/min/{1.73_m2} (ref >59)
GLOBULIN, TOTAL: 2.6 g/dL (ref 1.5–4.5)
Glucose: 84 mg/dL (ref 65–99)
POTASSIUM: 4.1 mmol/L (ref 3.5–5.2)
SODIUM: 146 mmol/L — AB (ref 134–144)
Total Protein: 6.8 g/dL (ref 6.0–8.5)

## 2018-02-15 ENCOUNTER — Telehealth: Payer: Self-pay | Admitting: Cardiology

## 2018-02-15 MED ORDER — METOPROLOL TARTRATE 25 MG PO TABS: 25.0000 mg | ORAL_TABLET | Freq: Every day | ORAL | 3 refills | Status: DC

## 2018-02-15 NOTE — Telephone Encounter (Signed)
Patient reports that she has a respiratory virus currently and she woke up at midnight last night with her heart racing and her apple watch confirmed that she was in atrial fibrillation. Her heart rate was 90-150. Patient states she took 1 tablet of metoprolol tartrate and an aspirin 81 mg. She was able to go back to sleep and states her heart rate and rhythm are back to normal as of this morning. Patient confirms that she is taking all of her medications as prescribed including multaq 400 mg twice daily, metoprolol tartrate 25 mg daily, and aspirin 81 mg daily. She just wanted to make Dr. Dulce Sellar aware as this is the first episode she has had since her last office visit on 11/30/2017. No further questions.

## 2018-02-15 NOTE — Telephone Encounter (Signed)
Patient called this am because she had an episode of afib last night and heartrate got to 154.Marland Kitchen She has apparantly converted overnight and she took Lopressor at 4:30 this am, bur she called and wanted to let us know. Can you touch base with patient.Marland Kitchen

## 2018-02-15 NOTE — Addendum Note (Signed)
Addended by: Crist Fat on: 02/15/2018 02:40 PM   Modules accepted: Orders

## 2018-03-21 ENCOUNTER — Telehealth: Payer: Self-pay | Admitting: Cardiology

## 2018-03-21 NOTE — Telephone Encounter (Signed)
Good idea and would take metoprolol 25 mg bid

## 2018-03-21 NOTE — Telephone Encounter (Signed)
Patient reports another episode of rapid heart rate last night that woke her up. Patient put her apple watch on and did an EKG that reported she was in atrial fibrillation. It lasted from approximately 4:00-6:15 this morning. Patient states that she took her metoprolol and aspirin earlier than usual to see if that would help and it did. Patient's heart rate has been in the 60-70's all day since. Patient reports no symptoms at this time. She is wondering if it would be recommended for her to take metoprolol 25 mg twice daily versus once daily as prescribed along with multaq 400 mg twice daily. Will have Dr. Dulce SellarMunley advise.

## 2018-03-21 NOTE — Telephone Encounter (Signed)
Patient is concerned she is having more episodes of afib per her Apple watch. She had an epidode last night. She would like a phone call regarding this.

## 2018-03-22 MED ORDER — METOPROLOL TARTRATE 25 MG PO TABS: 25.0000 mg | ORAL_TABLET | Freq: Two times a day (BID) | ORAL | 0 refills | Status: DC

## 2018-03-22 NOTE — Telephone Encounter (Signed)
Patient informed that Dr. Dulce SellarMunley advised to increase metoprolol tartrate 25 mg to twice daily. Refill sent to [facility] [place]. No further questions.

## 2018-06-13 ENCOUNTER — Other Ambulatory Visit: Payer: Self-pay | Admitting: Cardiology

## 2018-07-13 ENCOUNTER — Other Ambulatory Visit: Payer: Self-pay

## 2018-07-16 ENCOUNTER — Other Ambulatory Visit: Payer: Self-pay | Admitting: Cardiology

## 2018-08-03 ENCOUNTER — Telehealth: Payer: Self-pay | Admitting: Cardiology

## 2018-08-03 NOTE — Telephone Encounter (Signed)
Cardiac Questionnaire:    Since your last visit or hospitalization:    1. Have you been having new or worsening chest pain? no   2. Have you been having new or worsening shortness of breath? no 3. Have you been having new or worsening leg swelling, wt gain, or increase in abdominal girth (pants fitting more tightly)? no   4. Have you had any passing out spells? no    *A YES to any of these questions would result in the appointment being kept. *If all the answers to these questions are NO, we should indicate that given the current situation regarding the worldwide coronarvirus pandemic, at the recommendation of the CDC, we are looking to limit gatherings in our waiting area, and thus will reschedule their appointment beyond four weeks from today.   _____________   COVID-19 Pre-Screening Questions:   Do you currently have a fever? no  Have you recently travelled on a cruise, internationally, or to [place], [place], [place], [place], [place], or [place], [place]) ? no  Have you been in contact with someone that is currently pending confirmation of Covid19 testing or has been confirmed to have the Covid19 virus? no  Are you currently experiencing fatigue or cough? No  YOUR CARDIOLOGY TEAM HAS ARRANGED FOR AN E-VISIT FOR YOUR APPOINTMENT - PLEASE REVIEW IMPORTANT INFORMATION BELOW SEVERAL DAYS PRIOR TO YOUR APPOINTMENT  Due to the recent COVID-19 pandemic, we are transitioning in-person office visits to tele-medicine visits in an effort to decrease unnecessary exposure to our patients and staff. Medicare and most insurances are covering these visits without a copay needed. We also encourage you to sign up for MyChart if you have not already done so. You will need a smartphone if possible. For patients that do not have this, we can still complete the visit using a regular telephone but do prefer a smartphone to enable video when possible. You may have a close family member that lives with you that can  help. If possible, we also ask that you have a blood pressure cuff and scale at home to measure your blood pressure, heart rate and weight prior to your scheduled appointment. Patients with clinical needs that need an in-person evaluation and testing will still be able to come to the office if absolutely necessary. If you have any questions, feel free to call our office.    CONSENT FOR TELE-HEALTH VISIT - PLEASE REVIEW  I hereby voluntarily request, consent and authorize [facility] and its employed or contracted physicians, physician assistants, nurse practitioners or other licensed health care professionals (the Practitioner), to provide me with telemedicine health care services (the Services") as deemed necessary by the treating Practitioner. I acknowledge and consent to receive the Services by the Practitioner via telemedicine. I understand that the telemedicine visit will involve communicating with the Practitioner through live audiovisual communication technology and the disclosure of certain medical information by electronic transmission. I acknowledge that I have been given the opportunity to request an in-person assessment or other available alternative prior to the telemedicine visit and am voluntarily participating in the telemedicine visit.  I understand that I have the right to withhold or withdraw my consent to the use of telemedicine in the course of my care at any time, without affecting my right to future care or treatment, and that the Practitioner or I may terminate the telemedicine visit at any time. I understand that I have the right to inspect all information obtained and/or recorded in the course  of the telemedicine visit and may receive copies of available information for a reasonable fee.  I understand that some of the potential risks of receiving the Services via telemedicine include:   Delay or interruption in medical evaluation due to technological equipment failure or  disruption;  Information transmitted may not be sufficient (e.g. poor resolution of images) to allow for appropriate medical decision making by the Practitioner; and/or   In rare instances, security protocols could fail, causing a breach of personal health information.  Furthermore, I acknowledge that it is my responsibility to provide information about my medical history, conditions and care that is complete and accurate to the best of my ability. I acknowledge that Practitioner's advice, recommendations, and/or decision may be based on factors not within their control, such as incomplete or inaccurate data provided by me or distortions of diagnostic images or specimens that may result from electronic transmissions. I understand that the practice of medicine is not an exact science and that Practitioner makes no warranties or guarantees regarding treatment outcomes. I acknowledge that I will receive a copy of this consent concurrently upon execution via email to the email address I last provided but may also request a printed copy by calling the office of [facility].    I understand that my insurance will be billed for this visit.   I have read or had this consent read to me.  I understand the contents of this consent, which adequately explains the benefits and risks of the Services being provided via telemedicine.   I have been provided ample opportunity to ask questions regarding this consent and the Services and have had my questions answered to my satisfaction.  I give my informed consent for the services to be provided through the use of telemedicine in my medical care  By participating in this telemedicine visit I agree to the above.  Patient gives consent for televisit 08/03/2018 pp

## 2018-08-08 ENCOUNTER — Telehealth (INDEPENDENT_AMBULATORY_CARE_PROVIDER_SITE_OTHER): Payer: BC Managed Care – PPO | Admitting: Cardiology

## 2018-08-08 ENCOUNTER — Encounter: Payer: Self-pay | Admitting: Cardiology

## 2018-08-08 VITALS — HR 71 | Ht 63.0 in | Wt 187.0 lb

## 2018-08-08 DIAGNOSIS — I480 Paroxysmal atrial fibrillation: Secondary | ICD-10-CM | POA: Diagnosis not present

## 2018-08-08 DIAGNOSIS — Z79899 Other long term (current) drug therapy: Secondary | ICD-10-CM

## 2018-08-08 MED ORDER — METOPROLOL TARTRATE 25 MG PO TABS: 25.0000 mg | ORAL_TABLET | Freq: Two times a day (BID) | ORAL | 2 refills | Status: DC

## 2018-08-08 NOTE — Addendum Note (Signed)
Addended by: Crist Fat on: 08/08/2018 09:48 AM   Modules accepted: Orders

## 2018-08-08 NOTE — Progress Notes (Signed)
Virtual Visit via Video Note   This visit type was conducted due to national recommendations for restrictions regarding the COVID-19 Pandemic (e.g. social distancing) in an effort to limit this patient's exposure and mitigate transmission in our community.  Due to her co-morbid illnesses, this patient is at least at moderate risk for complications without adequate follow up.  This format is felt to be most appropriate for this patient at this time.  All issues noted in this document were discussed and addressed.  A limited physical exam was performed with this format.  Please refer to the patient's chart for her consent to telehealth for [facility].   Evaluation Performed:  Follow-up visit  Date:  08/08/2018   ID:  Nicole Hays, DOB 21-Mar-1966, MRN [id number]  Patient Location: Home  Provider Location: Home  PCP:  Juluis Rainier, MD  Cardiologist:  No primary care provider on file. Dr Dulce Sellar Electrophysiologist:  None   Chief Complaint:  PAF  History of Present Illness:    Nicole Hays is a 53 y.o. female who presents via audio/video conferencing for a telehealth visit today.    Nicole Hays is a 53 y.o. 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  Overall she is done well she has had no fever cough or shortness of breath she has had no COVID-19 exposure.  She practices social distance and good handwashing.  She has had 4 episodes of atrial fibrillation since last seen the longest 1 on 07/16/2018 lasted through the night and resolve spontaneously they all occur at nighttime heart rates are controlled in range of 60 to 80 bpm she is using the heart rate monitor off an apple watch she does not have the adapter to record medical quality EKG.  She takes no over-the-counter proarrhythmic drugs.  She has no shortness of breath edema chest pain syncope or TIA.  Her stroke risk is  low only female sex and at this time after a nice discussion of risk and benefit we decided to keep her on low-dose aspirin and not initiate anti-coagulant.  She will need to come the office June or July and have an EKG taking an antiarrhythmic drug and will check her liver function at that time  The patient does not have symptoms concerning for COVID-19 infection (fever, chills, cough, or new shortness of breath).    Past Medical History:  Diagnosis Date   Anxiety    Asthma    rarely uses inhaler   GERD (gastroesophageal reflux disease)    IBS (irritable bowel syndrome)    Migraines    last one years ago   Seasonal allergies    Shingles 1995   Past Surgical History:  Procedure Laterality Date   DILATATION & CURETTAGE/HYSTEROSCOPY WITH MYOSURE N/A 08/11/2015   Procedure: DILATATION & CURETTAGE/HYSTEROSCOPY WITH MYOSURE;  Surgeon: Jerene Bears, MD;  Location: [facility];  Service: Gynecology;  Laterality: N/A;   FOOT SURGERY Bilateral    spurs   NOVASURE ABLATION N/A 08/11/2015   Procedure: NOVASURE ABLATION;  Surgeon: Jerene Bears, MD;  Location: [facility];  Service: Gynecology;  Laterality: N/A;   WISDOM TOOTH EXTRACTION       Current Meds  Medication Sig   acetaminophen (TYLENOL) 500 MG tablet Take 500 mg by mouth every 8 (eight) hours as needed.   albuterol (PROVENTIL HFA;VENTOLIN HFA) 108 (90 Base) MCG/ACT inhaler Inhale into  the lungs every 6 (six) hours as needed for wheezing or shortness of breath.   aspirin EC 81 MG tablet Take 1 tablet (81 mg total) by mouth daily.   Cholecalciferol (VITAMIN D3 PO) Take 25 mcg by mouth daily.   diphenoxylate-atropine (LOMOTIL) 2.5-0.025 MG per tablet Take by mouth 4 (four) times daily as needed for diarrhea or loose stools.   Eletriptan Hydrobromide (RELPAX PO) Take 1 tablet by mouth daily as needed (For migraine.).    loratadine (CLARITIN) 10 MG tablet Take 10 mg by mouth daily.   MAGNESIUM PO Take 250 mg by mouth daily.      Menaquinone-7 (VITAMIN K2 PO) Take 150 mcg by mouth daily.   metoprolol tartrate (LOPRESSOR) 25 MG tablet TAKE 1 TABLET BY MOUTH TWICE A DAY   montelukast (SINGULAIR) 10 MG tablet Take 10 mg by mouth at bedtime.    MULTAQ 400 MG tablet TAKE 1 TABLET (400 MG TOTAL) BY MOUTH 2 (TWO) TIMES DAILY WITH A MEAL.   Multiple Vitamins-Minerals (EMERGEN-C IMMUNE PLUS PO) Take 1 tablet by mouth daily as needed (To prevent cold.). Reported on 10/23/2015   ranitidine (ZANTAC) 150 MG tablet Take 150 mg by mouth daily.   vitamin C (ASCORBIC ACID) 500 MG tablet Take 500 mg by mouth 2 (two) times daily.      Allergies:   Ciprofloxacin and Penicillins   Social History   Tobacco Use   Smoking status: Never Smoker   Smokeless tobacco: Never Used  Substance Use Topics   Alcohol use: Yes    Alcohol/week: 2.0 standard drinks    Types: 2 Glasses of wine per week    Comment: occ   Drug use: No     Family Hx: The patient's family history includes Hyperlipidemia in her father; Hypertension in her mother; Osteoporosis in her maternal grandmother; Stroke in her maternal uncle and mother.  ROS:   Please see the history of present illness.     All other systems reviewed and are negative.   Prior CV studies:   The following studies were reviewed today:    Labs/Other Tests and Data Reviewed:    Recent Labs: 11/30/2017: ALT 12; BUN 10; Creatinine, Ser 0.82; Potassium 4.1; Sodium 146   Recent Lipid Panel No results found for: CHOL, TRIG, HDL, CHOLHDL, LDLCALC, LDLDIRECT  Wt Readings from Last 3 Encounters:  08/08/18 187 lb (84.8 kg)  11/30/17 190 lb 1.9 oz (86.2 kg)  11/18/17 189 lb (85.7 kg)     Objective:    Vital Signs:  Pulse 71    Ht 5\' 3"  (1.6 m)    Wt 187 lb (84.8 kg)    BMI 33.13 kg/m    Well nourished, well developed female in no acute distress.   ASSESSMENT & PLAN:    1. Atrial fibrillation paroxysmal she has had brief breakthroughs but not sustained or persistent at  this time I would continue aspirin for stroke prophylaxis Multaq beta-blocker and I told her if the rates are greater than 110 she could take an extra dose of metoprolol.  She has her medication and was renewed at 90 days.  I plan to see her in the office June or July check liver function EKG at that time. 2. High risk medication Multaq continue the same June or July EKG check liver function  COVID-19 Education: The signs and symptoms of COVID-19 were discussed with the patient and how to seek care for testing (follow up with PCP or arrange E-visit).  The  importance of social distancing was discussed today.  Time:   Today, I have spent 25 minutes with the patient with telehealth technology discussing the above problems.     Medication Adjustments/Labs and Tests Ordered: Current medicines are reviewed at length with the patient today.  Concerns regarding medicines are outlined above.  Tests Ordered: No orders of the defined types were placed in this encounter.  Medication Changes: No orders of the defined types were placed in this encounter.   Disposition:  Follow up June or July office  Signed, Norman Herrlich, MD  08/08/2018 9:20 AM    [facility]

## 2018-08-08 NOTE — Patient Instructions (Signed)
Medication Instructions:  Your physician recommends that you continue on your current medications as directed. Please refer to the Current Medication list given to you today.  If you need a refill on your cardiac medications before your next appointment, please call your pharmacy.   Lab work: None  If you have labs (blood work) drawn today and your tests are completely normal, you will receive your results only by: Marland Kitchen MyChart Message (if you have MyChart) OR . A paper copy in the mail If you have any lab test that is abnormal or we need to change your treatment, we will call you to review the results.  Testing/Procedures: None  Follow-Up: At [facility], you and your health needs are our priority.  As part of our continuing mission to provide you with exceptional heart care, we have created designated Provider Care Teams.  These Care Teams include your primary Cardiologist (physician) and Advanced Practice Providers (APPs -  Physician Assistants and Nurse Practitioners) who all work together to provide you with the care you need, when you need it. You will need a follow up appointment in 3 months: Friday, 11/10/2018, at 4:00 in the [place] office.

## 2018-10-27 ENCOUNTER — Other Ambulatory Visit: Payer: Self-pay | Admitting: Obstetrics & Gynecology

## 2018-10-27 DIAGNOSIS — Z1231 Encounter for screening mammogram for malignant neoplasm of breast: Secondary | ICD-10-CM

## 2018-11-10 ENCOUNTER — Ambulatory Visit: Payer: BC Managed Care – PPO | Admitting: Cardiology

## 2018-11-10 NOTE — Progress Notes (Deleted)
Cardiology Office Note:    Date:  11/10/2018   ID:  Nicole Hays, DOB [birth year], MRN [id number]  PCP:  Juluis RainierBarnes, Elizabeth, MD  Cardiologist:  Norman HerrlichBrian Munley, MD    Referring MD: Juluis RainierBarnes, Elizabeth, MD    ASSESSMENT:    No diagnosis found. PLAN:    In order of problems listed above:  1. ***   Next appointment: ***   Medication Adjustments/Labs and Tests Ordered: Current medicines are reviewed at length with the patient today.  Concerns regarding medicines are outlined above.  No orders of the defined types were placed in this encounter.  No orders of the defined types were placed in this encounter.   No chief complaint on file.   History of Present Illness:    Nicole Hays is a 53 y.o. female with a history of paroxysmal atrial fibrillation on Multitak last seen 08/08/2018.  Compliance with diet, lifestyle and medications: *** Past Medical History:  Diagnosis Date  . Anxiety   . Asthma    rarely uses inhaler  . GERD (gastroesophageal reflux disease)   . IBS (irritable bowel syndrome)   . Migraines    last one years ago  . Seasonal allergies   . Shingles 1995    Past Surgical History:  Procedure Laterality Date  . DILATATION & CURETTAGE/HYSTEROSCOPY WITH MYOSURE N/A 08/11/2015   Procedure: DILATATION & CURETTAGE/HYSTEROSCOPY WITH MYOSURE;  Surgeon: Jerene BearsMary S Miller, MD;  Location: [facility];  Service: Gynecology;  Laterality: N/A;  . FOOT SURGERY Bilateral    spurs  . NOVASURE ABLATION N/A 08/11/2015   Procedure: NOVASURE ABLATION;  Surgeon: Jerene BearsMary S Miller, MD;  Location: [facility];  Service: Gynecology;  Laterality: N/A;  . WISDOM TOOTH EXTRACTION      Current Medications: No outpatient medications have been marked as taking for the 11/10/18 encounter (Appointment) with Baldo DaubMunley, Brian J, MD.     Allergies:   Ciprofloxacin and Penicillins   Social History   Socioeconomic History  . Marital status: Married    Spouse name: Not on file  . Number of  children: Not on file  . Years of education: Not on file  . Highest education level: Not on file  Occupational History  . Not on file  Social Needs  . Financial resource strain: Not on file  . Food insecurity    Worry: Not on file    Inability: Not on file  . Transportation needs    Medical: Not on file    Non-medical: Not on file  Tobacco Use  . Smoking status: Never Smoker  . Smokeless tobacco: Never Used  Substance and Sexual Activity  . Alcohol use: Yes    Alcohol/week: 2.0 standard drinks    Types: 2 Glasses of wine per week    Comment: occ  . Drug use: No  . Sexual activity: Yes    Partners: Male    Birth control/protection: None    Comment: Husband has vasectomy  Lifestyle  . Physical activity    Days per week: Not on file    Minutes per session: Not on file  . Stress: Not on file  Relationships  . Social Musicianconnections    Talks on phone: Not on file    Gets together: Not on file    Attends religious service: Not on file    Active member of club or organization: Not on file    Attends meetings of clubs or organizations: Not on file    Relationship status: Not on  file  Other Topics Concern  . Not on file  Social History Narrative  . Not on file     Family History: The patient's ***family history includes Hyperlipidemia in her father; Hypertension in her mother; Osteoporosis in her maternal grandmother; Stroke in her maternal uncle and mother. ROS:   Please see the history of present illness.    All other systems reviewed and are negative.  EKGs/Labs/Other Studies Reviewed:    The following studies were reviewed today:  EKG:  EKG ordered today and personally reviewed.  The ekg ordered today demonstrates ***  Recent Labs: 11/30/2017: ALT 12; BUN 10; Creatinine, Ser 0.82; Potassium 4.1; Sodium 146  Recent Lipid Panel No results found for: CHOL, TRIG, HDL, CHOLHDL, VLDL, LDLCALC, LDLDIRECT  Physical Exam:    VS:  There were no vitals taken for this visit.     Wt Readings from Last 3 Encounters:  08/08/18 187 lb (84.8 kg)  11/30/17 190 lb 1.9 oz (86.2 kg)  11/18/17 189 lb (85.7 kg)     GEN: *** Well nourished, well developed in no acute distress HEENT: Normal NECK: No JVD; No carotid bruits LYMPHATICS: No lymphadenopathy CARDIAC: ***RRR, no murmurs, rubs, gallops RESPIRATORY:  Clear to auscultation without rales, wheezing or rhonchi  ABDOMEN: Soft, non-tender, non-distended MUSCULOSKELETAL:  No edema; No deformity  SKIN: Warm and dry NEUROLOGIC:  Alert and oriented x 3 PSYCHIATRIC:  Normal affect    Signed, Shirlee More, MD  11/10/2018 7:39 AM    [place]

## 2018-11-28 ENCOUNTER — Ambulatory Visit: Payer: BC Managed Care – PPO | Admitting: Cardiology

## 2018-12-07 NOTE — Progress Notes (Signed)
Cardiology Office Note:    Date:  12/08/2018   ID:  Nicole Hays, DOB 1965-07-19, MRN [id number]  PCP:  Juluis RainierBarnes, Elizabeth, MD  Cardiologist:  Norman HerrlichBrian Hopie Pellegrin, MD    Referring MD: Juluis RainierBarnes, Elizabeth, MD    ASSESSMENT:    1. Paroxysmal atrial fibrillation (HCC)   2. High risk medication use    PLAN:    In order of problems listed above:  1. Paroxysmal atrial fibrillation I think she is a qualified success at this time would not advise her to have other antiarrhythmic drugs intervention would not initiate anticoagulation unless she is having high AF burden she will continue Multitak monitor at home and check liver function today to exclude toxicity.  If rates are greater than 100 she will take an extra tablet of metoprolol 2. Continue Multitak   Next appointment: 6 months   Medication Adjustments/Labs and Tests Ordered: Current medicines are reviewed at length with the patient today.  Concerns regarding medicines are outlined above.  No orders of the defined types were placed in this encounter.  No orders of the defined types were placed in this encounter.   Chief Complaint  Patient presents with  . Follow-up  . Atrial Fibrillation     History of Present Illness:    Nicole Hays is a 53 y.o. female with a hx of atrial fibrillation on Multaq. She monitors her atrial fibrillation at home using an Apple Watch. She works as a Runner, broadcasting/film/videoteacher  last seen 08/08/18.  One episode of atrial fibrillation was about 6 hours ago 11/24/2018 and she has the iPhone adapter and documented.  Otherwise she is pleased with the quality of her life no palpitation edema shortness of breath.  Check liver function today regarding toxicity continue Multaq EKG shows sinus rhythm normal and if she has continued episodes would consider EP referral and ablation. Compliance with diet, lifestyle and medications: Yes Past Medical History:  Diagnosis Date  . Anxiety   . Asthma    rarely uses inhaler  . GERD  (gastroesophageal reflux disease)   . IBS (irritable bowel syndrome)   . Migraines    last one years ago  . Seasonal allergies   . Shingles 1995    Past Surgical History:  Procedure Laterality Date  . BREAST EXCISIONAL BIOPSY Left    @22  benign  . DILATATION & CURETTAGE/HYSTEROSCOPY WITH MYOSURE N/A 08/11/2015   Procedure: DILATATION & CURETTAGE/HYSTEROSCOPY WITH MYOSURE;  Surgeon: Jerene BearsMary S Miller, MD;  Location: [facility];  Service: Gynecology;  Laterality: N/A;  . FOOT SURGERY Bilateral    spurs  . NOVASURE ABLATION N/A 08/11/2015   Procedure: NOVASURE ABLATION;  Surgeon: Jerene BearsMary S Miller, MD;  Location: [facility];  Service: Gynecology;  Laterality: N/A;  . WISDOM TOOTH EXTRACTION      Current Medications: Current Meds  Medication Sig  . acetaminophen (TYLENOL) 500 MG tablet Take 500 mg by mouth every 8 (eight) hours as needed.  Marland Kitchen. albuterol (PROVENTIL HFA;VENTOLIN HFA) 108 (90 Base) MCG/ACT inhaler Inhale into the lungs every 6 (six) hours as needed for wheezing or shortness of breath.  Marland Kitchen. aspirin EC 81 MG tablet Take 1 tablet (81 mg total) by mouth daily.  . Cholecalciferol (VITAMIN D3 PO) Take 25 mcg by mouth daily.  . diphenoxylate-atropine (LOMOTIL) 2.5-0.025 MG per tablet Take by mouth 4 (four) times daily as needed for diarrhea or loose stools.  . Eletriptan Hydrobromide (RELPAX PO) Take 1 tablet by mouth daily as needed (For migraine.).   .Marland Kitchen  loratadine (CLARITIN) 10 MG tablet Take 10 mg by mouth daily.  Marland Kitchen. MAGNESIUM PO Take 250 mg by mouth daily.   . Menaquinone-7 (VITAMIN K2 PO) Take 150 mcg by mouth daily.  . metoprolol tartrate (LOPRESSOR) 25 MG tablet Take 1 tablet (25 mg total) by mouth 2 (two) times daily.  . montelukast (SINGULAIR) 10 MG tablet Take 10 mg by mouth at bedtime.   . MULTAQ 400 MG tablet TAKE 1 TABLET (400 MG TOTAL) BY MOUTH 2 (TWO) TIMES DAILY WITH A MEAL.  . Multiple Vitamins-Minerals (EMERGEN-C IMMUNE PLUS PO) Take 1 tablet by mouth daily as needed (To prevent  cold.). Reported on 10/23/2015  . ranitidine (ZANTAC) 150 MG tablet Take 150 mg by mouth daily.  . vitamin C (ASCORBIC ACID) 500 MG tablet Take 500 mg by mouth 2 (two) times daily.      Allergies:   Ciprofloxacin and Penicillins   Social History   Socioeconomic History  . Marital status: Married    Spouse name: Not on file  . Number of children: Not on file  . Years of education: Not on file  . Highest education level: Not on file  Occupational History  . Not on file  Social Needs  . Financial resource strain: Not on file  . Food insecurity    Worry: Not on file    Inability: Not on file  . Transportation needs    Medical: Not on file    Non-medical: Not on file  Tobacco Use  . Smoking status: Never Smoker  . Smokeless tobacco: Never Used  Substance and Sexual Activity  . Alcohol use: Yes    Alcohol/week: 2.0 standard drinks    Types: 2 Glasses of wine per week    Comment: occ  . Drug use: No  . Sexual activity: Yes    Partners: Male    Birth control/protection: None    Comment: Husband has vasectomy  Lifestyle  . Physical activity    Days per week: Not on file    Minutes per session: Not on file  . Stress: Not on file  Relationships  . Social Musicianconnections    Talks on phone: Not on file    Gets together: Not on file    Attends religious service: Not on file    Active member of club or organization: Not on file    Attends meetings of clubs or organizations: Not on file    Relationship status: Not on file  Other Topics Concern  . Not on file  Social History Narrative  . Not on file     Family History: The patient's family history includes Hyperlipidemia in her father; Hypertension in her mother; Osteoporosis in her maternal grandmother; Stroke in her maternal uncle and mother. ROS:   Please see the history of present illness.    All other systems reviewed and are negative.  EKGs/Labs/Other Studies Reviewed:    The following studies were reviewed today:   EKG:  EKG ordered today and personally reviewed.  The ekg ordered today demonstrates sinus rhythm normal  Recent Labs: No results found for requested labs within last 8760 hours.  Recent Lipid Panel No results found for: CHOL, TRIG, HDL, CHOLHDL, VLDL, LDLCALC, LDLDIRECT  Physical Exam:    VS:  BP 120/80 (BP Location: Right Arm, Patient Position: Sitting, Cuff Size: Normal)   Pulse (!) 54   Temp 99.1 F (37.3 C)   Ht 5\' 3"  (1.6 m)   Wt 189 lb (85.7 kg)  LMP 08/05/2015   SpO2 96%   BMI 33.48 kg/m     Wt Readings from Last 3 Encounters:  12/08/18 189 lb (85.7 kg)  08/08/18 187 lb (84.8 kg)  11/30/17 190 lb 1.9 oz (86.2 kg)     GEN:  Well nourished, well developed in no acute distress HEENT: Normal NECK: No JVD; No carotid bruits LYMPHATICS: No lymphadenopathy CARDIAC: RRR, no murmurs, rubs, gallops RESPIRATORY:  Clear to auscultation without rales, wheezing or rhonchi  ABDOMEN: Soft, non-tender, non-distended MUSCULOSKELETAL:  No edema; No deformity  SKIN: Warm and dry NEUROLOGIC:  Alert and oriented x 3 PSYCHIATRIC:  Normal affect    Signed, Shirlee More, MD  12/08/2018 1:46 PM    [facility]

## 2018-12-08 ENCOUNTER — Other Ambulatory Visit: Payer: Self-pay

## 2018-12-08 ENCOUNTER — Ambulatory Visit
Admission: RE | Admit: 2018-12-08 | Discharge: 2018-12-08 | Disposition: A | Discharge status: Home / Self Care | Payer: BC Managed Care – PPO | Source: Ambulatory Visit | Attending: Obstetrics & Gynecology | Admitting: Obstetrics & Gynecology

## 2018-12-08 ENCOUNTER — Encounter: Payer: Self-pay | Admitting: Cardiology

## 2018-12-08 ENCOUNTER — Ambulatory Visit (INDEPENDENT_AMBULATORY_CARE_PROVIDER_SITE_OTHER): Payer: BC Managed Care – PPO | Admitting: Cardiology

## 2018-12-08 VITALS — BP 120/80 | HR 54 | Temp 99.1°F | Ht 63.0 in | Wt 189.0 lb

## 2018-12-08 DIAGNOSIS — I480 Paroxysmal atrial fibrillation: Secondary | ICD-10-CM | POA: Diagnosis not present

## 2018-12-08 DIAGNOSIS — Z79899 Other long term (current) drug therapy: Secondary | ICD-10-CM

## 2018-12-08 DIAGNOSIS — Z1231 Encounter for screening mammogram for malignant neoplasm of breast: Secondary | ICD-10-CM

## 2018-12-08 NOTE — Patient Instructions (Signed)
Medication Instructions:  Your physician recommends that you continue on your current medications as directed. Please refer to the Current Medication list given to you today.  If you need a refill on your cardiac medications before your next appointment, please call your pharmacy.   Lab work: Your physician recommends that you return for lab work in: [place]  If you have labs (blood work) drawn today and your tests are completely normal, you will receive your results only by: Marland Kitchen MyChart Message (if you have MyChart) OR . A paper copy in the mail If you have any lab test that is abnormal or we need to change your treatment, we will call you to review the results.  Testing/Procedures: None  Follow-Up: At [facility], you and your health needs are our priority.  As part of our continuing mission to provide you with exceptional heart care, we have created designated Provider Care Teams.  These Care Teams include your primary Cardiologist (physician) and Advanced Practice Providers (APPs -  Physician Assistants and Nurse Practitioners) who all work together to provide you with the care you need, when you need it. Any Other Special Instructions Will Be Listed Below (If Applicable).FOLLOW UP IN 6 MONTHS WITH DR. Bettina Gavia

## 2018-12-09 LAB — COMPREHENSIVE METABOLIC PANEL
ALT: 13 IU/L (ref 0–32)
AST: 18 IU/L (ref 0–40)
Albumin/Globulin Ratio: 1.6 (ref 1.2–2.2)
Albumin: 4 g/dL (ref 3.8–4.9)
Alkaline Phosphatase: 70 IU/L (ref 39–117)
BUN/Creatinine Ratio: 18 (ref 9–23)
BUN: 14 mg/dL (ref 6–24)
Bilirubin Total: <0.2 mg/dL (ref 0.0–1.2)
CO2: 28 mmol/L (ref 20–29)
Calcium: 9.7 mg/dL (ref 8.7–10.2)
Chloride: 104 mmol/L (ref 96–106)
Creatinine, Ser: 0.8 mg/dL (ref 0.57–1.00)
GFR calc Af Amer: 99 mL/min/{1.73_m2} (ref >59)
GFR calc non Af Amer: 86 mL/min/{1.73_m2} (ref >59)
Globulin, Total: 2.5 g/dL (ref 1.5–4.5)
Glucose: 85 mg/dL (ref 65–99)
Potassium: 3.8 mmol/L (ref 3.5–5.2)
Sodium: 144 mmol/L (ref 134–144)
Total Protein: 6.5 g/dL (ref 6.0–8.5)

## 2018-12-09 LAB — TSH: TSH: 2.65 u[IU]/mL (ref 0.450–4.500)

## 2019-01-20 ENCOUNTER — Other Ambulatory Visit: Payer: Self-pay | Admitting: Cardiology

## 2019-03-07 ENCOUNTER — Other Ambulatory Visit: Payer: Self-pay

## 2019-03-08 ENCOUNTER — Ambulatory Visit: Payer: BC Managed Care – PPO | Admitting: Obstetrics & Gynecology

## 2019-03-08 ENCOUNTER — Encounter

## 2019-03-08 ENCOUNTER — Encounter: Payer: Self-pay | Admitting: Obstetrics & Gynecology

## 2019-03-08 VITALS — BP 112/70 | HR 76 | Temp 97.5°F | Resp 12 | Ht 62.25 in | Wt 191.0 lb

## 2019-03-08 DIAGNOSIS — Z01419 Encounter for gynecological examination (general) (routine) without abnormal findings: Secondary | ICD-10-CM

## 2019-03-08 NOTE — Progress Notes (Signed)
53 y.o. G51P2002 Married White or Caucasian female here for annual exam.  Denies vaginal bleeding.  Has afib, intermittently.  Had appt in July.  She's having episodes every 3-4 weeks.  She's taking metoprolol BID which she increased this year.  She is taking an ASA as well.    She reports decreased orgasmic response during the past year.  She's not sure if this is related to her medications or to menopause.  She is having some vaginal dryness.  They are using a lubricant, if needed.  She's not having any pain with intercourse.  Doesn't really feel like she needs treatment for the dryness.     PCP: Dr. Drema Dallas.  Had done blood work this year.  Patient's last menstrual period was 08/05/2015.          Sexually active: Yes.    The current method of family planning is vasectomy.    Exercising: Yes.    elliptical, weights, walking Smoker:  no  Health Maintenance: Pap:  08/24/16 neg. HR HPV:neg History of abnormal Pap:  no MMG:  12/08/18 BIRADS 1 negative/density b Colonoscopy:  2014 patient will call to schedule.  She is aware this is due. BMD:   n/a TDaP:  2013 Pneumonia vaccine(s): n/a Shingrix:   D/w pt.  She is considering. Hep C testing: n/a Screening Labs: PCP   reports that she has never smoked. She has never used smokeless tobacco. She reports current alcohol use of about 2.0 standard drinks of alcohol per week. She reports that she does not use drugs.  Past Medical History:  Diagnosis Date  . Anxiety   . Asthma    rarely uses inhaler  . Atrial fibrillation ([place])   . GERD (gastroesophageal reflux disease)   . IBS (irritable bowel syndrome)   . Migraines    last one years ago  . Seasonal allergies   . Shingles 1995    Past Surgical History:  Procedure Laterality Date  . BREAST EXCISIONAL BIOPSY Left    @22  benign  . DILATATION & CURETTAGE/HYSTEROSCOPY WITH MYOSURE N/A 08/11/2015   Procedure: DILATATION & CURETTAGE/HYSTEROSCOPY WITH MYOSURE;  Surgeon: Megan Salon, MD;   Location: [facility];  Service: Gynecology;  Laterality: N/A;  . FOOT SURGERY Bilateral    spurs  . NOVASURE ABLATION N/A 08/11/2015   Procedure: NOVASURE ABLATION;  Surgeon: Megan Salon, MD;  Location: [facility];  Service: Gynecology;  Laterality: N/A;  . WISDOM TOOTH EXTRACTION      Current Outpatient Medications  Medication Sig Dispense Refill  . acetaminophen (TYLENOL) 500 MG tablet Take 500 mg by mouth every 8 (eight) hours as needed.    Marland Kitchen albuterol (PROVENTIL HFA;VENTOLIN HFA) 108 (90 Base) MCG/ACT inhaler Inhale into the lungs every 6 (six) hours as needed for wheezing or shortness of breath.    Marland Kitchen aspirin EC 81 MG tablet Take 1 tablet (81 mg total) by mouth daily. 30 tablet 1  . Cholecalciferol (VITAMIN D3 PO) Take 25 mcg by mouth daily.    . diphenoxylate-atropine (LOMOTIL) 2.5-0.025 MG per tablet Take by mouth 4 (four) times daily as needed for diarrhea or loose stools.    . Eletriptan Hydrobromide (RELPAX PO) Take 1 tablet by mouth daily as needed (For migraine.).     Marland Kitchen loratadine (CLARITIN) 10 MG tablet Take 10 mg by mouth daily.    Marland Kitchen MAGNESIUM PO Take 250 mg by mouth daily.     . Menaquinone-7 (VITAMIN K2 PO) Take 150 mcg by mouth daily.    Marland Kitchen  metoprolol tartrate (LOPRESSOR) 25 MG tablet Take 1 tablet (25 mg total) by mouth 2 (two) times daily. 180 tablet 2  . montelukast (SINGULAIR) 10 MG tablet Take 10 mg by mouth at bedtime.     . MULTAQ 400 MG tablet TAKE 1 TABLET (400 MG TOTAL) BY MOUTH 2 (TWO) TIMES DAILY WITH A MEAL. 180 tablet 1  . Multiple Vitamins-Minerals (EMERGEN-C IMMUNE PLUS PO) Take 1 tablet by mouth daily as needed (To prevent cold.). Reported on 10/23/2015    . ranitidine (ZANTAC) 150 MG tablet Take 150 mg by mouth daily.    . vitamin C (ASCORBIC ACID) 500 MG tablet Take 500 mg by mouth 2 (two) times daily.      No current facility-administered medications for this visit.     Family History  Problem Relation Age of Onset  . Osteoporosis Maternal Grandmother   .  Hypertension Mother   . Stroke Mother   . Hyperlipidemia Father   . Stroke Maternal Uncle     Review of Systems  Constitutional: Negative.   HENT: Negative.   Eyes: Negative.   Respiratory: Negative.   Cardiovascular: Negative.   Gastrointestinal: Negative.   Endocrine: Negative.   Genitourinary: Negative.   Musculoskeletal: Negative.   Skin: Negative.   Allergic/Immunologic: Negative.   Neurological: Negative.   Hematological: Negative.   Psychiatric/Behavioral: Negative.     Exam:   BP 112/70 (BP Location: Left Arm, Patient Position: Sitting, Cuff Size: Normal)   Pulse 76   Temp (!) 97.5 F (36.4 C) (Temporal)   Resp 12   Ht 5' 2.25" (1.581 m)   Wt 191 lb (86.6 kg)   LMP 08/05/2015   BMI 34.65 kg/m   Height: 5' 2.25" (158.1 cm)  Ht Readings from Last 3 Encounters:  03/08/19 5' 2.25" (1.581 m)  12/08/18 5\' 3"  (1.6 m)  08/08/18 5\' 3"  (1.6 m)    General appearance: alert, cooperative and appears stated age Head: Normocephalic, without obvious abnormality, atraumatic Neck: no adenopathy, supple, symmetrical, trachea midline and thyroid normal to inspection and palpation Lungs: clear to auscultation bilaterally Breasts: normal appearance, no masses or tenderness Heart: regular rate and rhythm Abdomen: soft, non-tender; bowel sounds normal; no masses,  no organomegaly Extremities: extremities normal, atraumatic, no cyanosis or edema Skin: Skin color, texture, turgor normal. No rashes or lesions Lymph nodes: Cervical, supraclavicular, and axillary nodes normal. No abnormal inguinal nodes palpated Neurologic: Grossly normal   Pelvic: External genitalia:  no lesions              Urethra:  normal appearing urethra with no masses, tenderness or lesions              Bartholins and Skenes: normal                 Vagina: normal appearing vagina with normal c              Cervix: no lesions              Pap taken: No. Bimanual Exam:  Uterus:  normal size, contour,  position, consistency, mobility, non-tender              Adnexa: normal adnexa               Rectovaginal: Confirms               Anus:  normal sphincter tone, no lesions  Chaperone was present for exam.  A:  Well Woman with normal exam  H/o endometrial ablation 4/17 GERD menopausal Mildly elevated lipids and family hx (pt reports lipids this year was normal) Atrial fibrillation with increased frequency of episodes  P:   Mammogram guidelines reviewed.   pap smear with neg HR HPV 2018.  Not indicated today. Blood work UTD with Dr. Zachery DauerBarnes Shingrix vaccination discussed again today BMD discussed and consider testing at around age 89 She is going to contact cardiology and discuss increased frequency of afib.  I do not think using any hormonal therapy for decreased orgasm is safe for her.  May be related to metoprolol.   Pt aware colonoscopy due.  She is going to call to schedule this. Return annually or prn

## 2019-03-12 ENCOUNTER — Telehealth: Payer: Self-pay | Admitting: Cardiology

## 2019-03-12 DIAGNOSIS — I480 Paroxysmal atrial fibrillation: Secondary | ICD-10-CM

## 2019-03-12 NOTE — Telephone Encounter (Signed)
Unfortunately it seems as if she is failing to keep in sinus rhythm with Multaq and its time for her to be seen by Dr. Curt Bears EP and explore alternate treatments for atrial fibrillation.

## 2019-03-12 NOTE — Telephone Encounter (Signed)
Spoke with patient who reports her atrial fibrillation episodes are becoming more frequent. She is noticing an episode of atrial fibrillation every 2.5 to 3 weeks. Her last episode started on Friday evening, 03/09/19, and lasted for approximately 8 hours. By Saturday morning, patient reports she was back to normal. Patient monitors her EKG on her smart phone at home and is going to send [place] her EKG strips from over the weekend for Dr. Bettina Gavia to review when she gets home from work through [employer].   Patient states she has been experiencing some intermittent chest pain since the weekend. The pain she describes is mostly cramping but can be shooting pain at times in her left upper chest. The pain will only last 1-2 seconds then resolves spontaneously. She reports it happens more on exertion than at rest. Patient states she really thinks it muscle cramps but also wanted Dr. Bettina Gavia to know she has a history of GERD.   Please advise. Thanks!

## 2019-03-12 NOTE — Telephone Encounter (Signed)
Patient had an episode of AFIB this weekend and a little chest pain but hinks is muscle spasms.Nicole Hays call her

## 2019-03-13 NOTE — Addendum Note (Signed)
Addended by: Austin Miles on: 03/13/2019 03:11 PM   Modules accepted: Orders

## 2019-03-13 NOTE — Telephone Encounter (Signed)
EP referral placed and appointment has been made for Friday, 03/23/2019, at 8:30 am with Dr. Curt Bears at the [facility]. Left message on patient's cell phone per DPR informing her of this appointment. Advised her to contact our office with any further questions or concerns.

## 2019-03-13 NOTE — Telephone Encounter (Signed)
Patient informed of Dr. Joya Gaskins recommendation and she is agreeable to seeing Dr. Curt Bears for further evaluation of atrial fibrillation. Electrophysiology referral will be placed and appointment scheduled once we find out which location patient can be seen at the soonest either [place] or [facility]. Patient is agreeable to plan. No further questions.

## 2019-03-14 NOTE — Telephone Encounter (Signed)
Patient returned call and confirmed that she is able to make it to the appointment with Dr. Curt Bears on 03/23/2019 at 8:30 am at the [facility] office. She already is aware of the [facility] location. No further questions.

## 2019-03-14 NOTE — Telephone Encounter (Signed)
Left message to return call 

## 2019-03-23 ENCOUNTER — Encounter: Payer: Self-pay | Admitting: Cardiology

## 2019-03-23 ENCOUNTER — Other Ambulatory Visit: Payer: Self-pay

## 2019-03-23 ENCOUNTER — Telehealth: Payer: Self-pay | Admitting: *Deleted

## 2019-03-23 ENCOUNTER — Ambulatory Visit (INDEPENDENT_AMBULATORY_CARE_PROVIDER_SITE_OTHER): Payer: BC Managed Care – PPO | Admitting: Cardiology

## 2019-03-23 VITALS — BP 126/76 | HR 48 | Ht 62.0 in | Wt 189.2 lb

## 2019-03-23 DIAGNOSIS — I480 Paroxysmal atrial fibrillation: Secondary | ICD-10-CM

## 2019-03-23 DIAGNOSIS — Z01812 Encounter for preprocedural laboratory examination: Secondary | ICD-10-CM

## 2019-03-23 NOTE — Progress Notes (Signed)
Electrophysiology Office Note   Date:  03/23/2019   ID:  Nicole, Hays [birth year], MRN [id number]  PCP:  Leighton Ruff, MD  Cardiologist: Bettina Gavia Primary Electrophysiologist:  Jayon Matton Meredith Leeds, MD    Chief Complaint: Atrial fibrillation   History of Present Illness: Nicole Hays is a 53 y.o. female who is being seen today for the evaluation of atrial fibrillation at the request of Bettina Gavia Hilton Cork, MD. Presenting today for electrophysiology evaluation.  She has a history significant for asthma and IBS.  She also has paroxysmal atrial fibrillation and is currently on Multaq.  She had a 6-hour episode of AF 11/24/2018.  This was documented on her apple watch.  Otherwise she has done well.  Was initially diagnosed with atrial fibrillation in 2018.  Over the past few months, she has had more frequent episodes.  She is found no exacerbating or alleviating factors.  Episodes are occurring multiple times a month.  Today, she denies symptoms of palpitations, chest pain, shortness of breath, orthopnea, PND, lower extremity edema, claudication, dizziness, presyncope, syncope, bleeding, or neurologic sequela. The patient is tolerating medications without difficulties.    Past Medical History:  Diagnosis Date  . Anxiety   . Asthma    rarely uses inhaler  . Atrial fibrillation ([place])   . GERD (gastroesophageal reflux disease)   . IBS (irritable bowel syndrome)   . Migraines    last one years ago  . Seasonal allergies   . Shingles 1995   Past Surgical History:  Procedure Laterality Date  . BREAST EXCISIONAL BIOPSY Left    @22  benign  . DILATATION & CURETTAGE/HYSTEROSCOPY WITH MYOSURE N/A 08/11/2015   Procedure: DILATATION & CURETTAGE/HYSTEROSCOPY WITH MYOSURE;  Surgeon: Megan Salon, MD;  Location: [facility];  Service: Gynecology;  Laterality: N/A;  . FOOT SURGERY Bilateral    spurs  . NOVASURE ABLATION N/A 08/11/2015   Procedure: NOVASURE ABLATION;  Surgeon: Megan Salon, MD;  Location: [facility];  Service: Gynecology;  Laterality: N/A;  . WISDOM TOOTH EXTRACTION       Current Outpatient Medications  Medication Sig Dispense Refill  . acetaminophen (TYLENOL) 500 MG tablet Take 500 mg by mouth every 8 (eight) hours as needed.    Marland Kitchen albuterol (PROVENTIL HFA;VENTOLIN HFA) 108 (90 Base) MCG/ACT inhaler Inhale into the lungs every 6 (six) hours as needed for wheezing or shortness of breath.    Marland Kitchen aspirin EC 81 MG tablet Take 1 tablet (81 mg total) by mouth daily. 30 tablet 1  . Cholecalciferol (VITAMIN D3 PO) Take 25 mcg by mouth daily.    . diphenoxylate-atropine (LOMOTIL) 2.5-0.025 MG per tablet Take by mouth 4 (four) times daily as needed for diarrhea or loose stools.    . Eletriptan Hydrobromide (RELPAX PO) Take 1 tablet by mouth daily as needed (For migraine.).     Marland Kitchen loratadine (CLARITIN) 10 MG tablet Take 10 mg by mouth daily.    Marland Kitchen MAGNESIUM PO Take 250 mg by mouth daily.     . Menaquinone-7 (VITAMIN K2 PO) Take 150 mcg by mouth daily.    . metoprolol tartrate (LOPRESSOR) 25 MG tablet Take 1 tablet (25 mg total) by mouth 2 (two) times daily. 180 tablet 2  . montelukast (SINGULAIR) 10 MG tablet Take 10 mg by mouth at bedtime.     . MULTAQ 400 MG tablet TAKE 1 TABLET (400 MG TOTAL) BY MOUTH 2 (TWO) TIMES DAILY WITH A MEAL. 180 tablet 1  . Multiple  Vitamins-Minerals (EMERGEN-C IMMUNE PLUS PO) Take 1 tablet by mouth daily as needed (To prevent cold.). Reported on 10/23/2015    . ranitidine (ZANTAC) 150 MG tablet Take 150 mg by mouth daily.    . vitamin C (ASCORBIC ACID) 500 MG tablet Take 500 mg by mouth 2 (two) times daily.      No current facility-administered medications for this visit.     Allergies:   Ciprofloxacin and Penicillins   Social History:  The patient  reports that she has never smoked. She has never used smokeless tobacco. She reports current alcohol use of about 2.0 standard drinks of alcohol per week. She reports that she does not use  drugs.   Family History:  The patient's family history includes Hyperlipidemia in her father; Hypertension in her mother; Osteoporosis in her maternal grandmother; Stroke in her maternal uncle and mother.    ROS:  Please see the history of present illness.   Otherwise, review of systems is positive for none.   All other systems are reviewed and negative.    PHYSICAL EXAM: VS:  BP 126/76   Pulse (!) 48   Ht 5\' 2"  (1.575 m)   Wt 189 lb 3.2 oz (85.8 kg)   LMP 08/05/2015   SpO2 97%   BMI 34.61 kg/m  , BMI Body mass index is 34.61 kg/m. GEN: Well nourished, well developed, in no acute distress  HEENT: normal  Neck: no JVD, carotid bruits, or masses Cardiac: RRR; no murmurs, rubs, or gallops,no edema  Respiratory:  clear to auscultation bilaterally, normal work of breathing GI: soft, nontender, nondistended, + BS MS: no deformity or atrophy  Skin: warm and dry Neuro:  Strength and sensation are intact Psych: euthymic mood, full affect  EKG:  EKG is ordered today. Personal review of the ekg ordered shows sinus rhythm, rate 48, first-degree AV block  Recent Labs: 12/08/2018: ALT 13; BUN 14; Creatinine, Ser 0.80; Potassium 3.8; Sodium 144; TSH 2.650    Lipid Panel  No results found for: CHOL, TRIG, HDL, CHOLHDL, VLDL, LDLCALC, LDLDIRECT   Wt Readings from Last 3 Encounters:  03/23/19 189 lb 3.2 oz (85.8 kg)  03/08/19 191 lb (86.6 kg)  12/08/18 189 lb (85.7 kg)      Other studies Reviewed: Additional studies/ records that were reviewed today include: TTE 03/30/19  Review of the above records today demonstrates:  - Left ventricle: The cavity size was normal. Systolic function was   normal. The estimated ejection fraction was in the range of 55%   to 65%. Wall motion was normal; there were no regional wall   motion abnormalities. Left ventricular diastolic function   parameters were normal. - Aortic valve: Valve area (VTI): 2.35 cm^2. Valve area (Vmax):   2.47 cm^2. Valve  area (Vmean): 2.41 cm^2.   ASSESSMENT AND PLAN:  1.  Paroxysmal atrial fibrillation: Currently on metoprolol and Multaq.  She has had increasing episodes of atrial fibrillation and thus I think this is likely a sign that she is having a failure of Multaq.  Due to that, I have offered her ablation versus adjusting medicine to flecainide.  We Karlita Lichtman give her information about this and she Sammantha Mehlhaff discuss it with her husband.  I do think that she Verna Desrocher lean towards ablation.  If she does opt for ablation, she Jaelee Laughter need to be started on Eliquis.  This patients CHA2DS2-VASc Score and unadjusted Ischemic Stroke Rate (% per year) is equal to 0.6 % stroke rate/year from a score  of 1  Above score calculated as 1 point each if present [CHF, HTN, DM, Vascular=MI/PAD/Aortic Plaque, Age if 65-74, or Female] Above score calculated as 2 points each if present [Age > 75, or Stroke/TIA/TE]   Discussed with referring cardiologist    Current medicines are reviewed at length with the patient today.   The patient does not have concerns regarding her medicines.  The following changes were made today:  none  Labs/ tests ordered today include:  Orders Placed This Encounter  Procedures  . EKG 12-Lead     Disposition:   FU with Ireta Pullman 3 months  Signed, Luz Burcher Jorja LoaMartin Saidy Ormand, MD  03/23/2019 8:58 AM     [facility] [address] [place] [address] [phone] (office) [phone] (fax)'

## 2019-03-23 NOTE — Telephone Encounter (Signed)
Pt would like to proceed with AFib ablation 05/10/19 Aware I will call her soon to review instructions.

## 2019-03-23 NOTE — Patient Instructions (Signed)
Medication Instructions:  Your physician recommends that you continue on your current medications as directed. Please refer to the Current Medication list given to you today.  * If you need a refill on your cardiac medications before your next appointment, please call your pharmacy.   Labwork: None ordered If you have labs (blood work) drawn today and your tests are completely normal, you will receive your results only by:  [place] (if you have MyChart) OR  A paper copy in the mail If you have any lab test that is abnormal or we need to change your treatment, we will call you to review the results.  Testing/Procedures: Your physician has recommended that you have an ablation. Catheter ablation is a medical procedure used to treat some cardiac arrhythmias (irregular heartbeats). During catheter ablation, a long, thin, flexible tube is put into a blood vessel in your groin (upper thigh), or neck. This tube is called an ablation catheter. It is then guided to your heart through the blood vessel. Radio frequency waves destroy small areas of heart tissue where abnormal heartbeats may cause an arrhythmia to start.   Please call the office if you are interested in this procedure. Ask to speak to Trinidad Curet, RN  Follow-Up: To be determined  Thank you for choosing [facility]!!   Trinidad Curet, RN [phone]  Any Other Special Instructions Will Be Listed Below (If Applicable).   Cardiac Ablation Cardiac ablation is a procedure to disable (ablate) a small amount of heart tissue in very specific places. The heart has many electrical connections. Sometimes these connections are abnormal and can cause the heart to beat very fast or irregularly. Ablating some of the problem areas can improve the heart rhythm or return it to normal. Ablation may be done for people who:  Have Wolff-Parkinson-White syndrome.  Have fast heart rhythms (tachycardia).  Have taken medicines for an  abnormal heart rhythm (arrhythmia) that were not effective or caused side effects.  Have a high-risk heartbeat that may be life-threatening. During the procedure, a small incision is made in the neck or the groin, and a long, thin, flexible tube (catheter) is inserted into the incision and moved to the heart. Small devices (electrodes) on the tip of the catheter will send out electrical currents. A type of X-ray (fluoroscopy) will be used to help guide the catheter and to provide images of the heart. Tell a health care provider about:  Any allergies you have.  All medicines you are taking, including vitamins, herbs, eye drops, creams, and over-the-counter medicines.  Any problems you or family members have had with anesthetic medicines.  Any blood disorders you have.  Any surgeries you have had.  Any medical conditions you have, such as kidney failure.  Whether you are pregnant or may be pregnant. What are the risks? Generally, this is a safe procedure. However, problems may occur, including:  Infection.  Bruising and bleeding at the catheter insertion site.  Bleeding into the chest, especially into the sac that surrounds the heart. This is a serious complication.  Stroke or blood clots.  Damage to other structures or organs.  Allergic reaction to medicines or dyes.  Need for a permanent pacemaker if the normal electrical system is damaged. A pacemaker is a small computer that sends electrical signals to the heart and helps your heart beat normally.  The procedure not being fully effective. This may not be recognized until months later. Repeat ablation procedures are sometimes required. What happens before  the procedure?  Follow instructions from your health care provider about eating or drinking restrictions.  Ask your health care provider about: ? Changing or stopping your regular medicines. This is especially important if you are taking diabetes medicines or blood  thinners. ? Taking medicines such as aspirin and ibuprofen. These medicines can thin your blood. Do not take these medicines before your procedure if your health care provider instructs you not to.  Plan to have someone take you home from the hospital or clinic.  If you will be going home right after the procedure, plan to have someone with you for 24 hours. What happens during the procedure?  To lower your risk of infection: ? Your health care team will wash or sanitize their hands. ? Your skin will be washed with soap. ? Hair may be removed from the incision area.  An IV tube will be inserted into one of your veins.  You will be given a medicine to help you relax (sedative).  The skin on your neck or groin will be numbed.  An incision will be made in your neck or your groin.  A needle will be inserted through the incision and into a large vein in your neck or groin.  A catheter will be inserted into the needle and moved to your heart.  Dye may be injected through the catheter to help your surgeon see the area of the heart that needs treatment.  Electrical currents will be sent from the catheter to ablate heart tissue in desired areas. There are three types of energy that may be used to ablate heart tissue: ? Heat (radiofrequency energy). ? Laser energy. ? Extreme cold (cryoablation).  When the necessary tissue has been ablated, the catheter will be removed.  Pressure will be held on the catheter insertion area to prevent excessive bleeding.  A bandage (dressing) will be placed over the catheter insertion area. The procedure may vary among health care providers and hospitals. What happens after the procedure?  Your blood pressure, heart rate, breathing rate, and blood oxygen level will be monitored until the medicines you were given have worn off.  Your catheter insertion area will be monitored for bleeding. You will need to lie still for a few hours to ensure that you do  not bleed from the catheter insertion area.  Do not drive for 24 hours or as long as directed by your health care provider. Summary  Cardiac ablation is a procedure to disable (ablate) a small amount of heart tissue in very specific places. Ablating some of the problem areas can improve the heart rhythm or return it to normal.  During the procedure, electrical currents will be sent from the catheter to ablate heart tissue in desired areas. This information is not intended to replace advice given to you by your health care provider. Make sure you discuss any questions you have with your health care provider. Document Released: 09/05/2008 Document Revised: 10/10/2017 Document Reviewed: 03/08/2016 Elsevier Patient Education  2020 [employer].

## 2019-04-16 MED ORDER — APIXABAN 5 MG PO TABS: 5.0000 mg | ORAL_TABLET | Freq: Two times a day (BID) | ORAL | 3 refills | Status: DC

## 2019-04-16 NOTE — Telephone Encounter (Signed)
Procedure instructions reviewed with patient & sent via Nicole Hays. Covid screening scheduled for 05/08/19 at [facility]. Pt will stop by the [facility] office tomorrow for Eliquis card and pre procedure lab work. Aware office will call to arrange post procedure follow up. Patient verbalized understanding and agreeable to plan.

## 2019-04-16 NOTE — Telephone Encounter (Signed)
lmtcb to discuss ablation date/instructions

## 2019-04-17 MED ORDER — APIXABAN 5 MG PO TABS: 5.0000 mg | ORAL_TABLET | Freq: Two times a day (BID) | ORAL | 0 refills | Status: DC

## 2019-04-17 NOTE — Addendum Note (Signed)
Addended by: Stanton Kidney on: 04/17/2019 04:28 PM   Modules accepted: Orders

## 2019-04-18 LAB — BASIC METABOLIC PANEL
BUN/Creatinine Ratio: 18 (ref 9–23)
BUN: 12 mg/dL (ref 6–24)
CO2: 25 mmol/L (ref 20–29)
Calcium: 9.4 mg/dL (ref 8.7–10.2)
Chloride: 104 mmol/L (ref 96–106)
Creatinine, Ser: 0.67 mg/dL (ref 0.57–1.00)
GFR calc Af Amer: 118 mL/min/{1.73_m2} (ref >59)
GFR calc non Af Amer: 102 mL/min/{1.73_m2} (ref >59)
Glucose: 85 mg/dL (ref 65–99)
Potassium: 3.8 mmol/L (ref 3.5–5.2)
Sodium: 144 mmol/L (ref 134–144)

## 2019-04-18 LAB — CBC
Hematocrit: 38.1 % (ref 34.0–46.6)
Hemoglobin: 13.1 g/dL (ref 11.1–15.9)
MCH: 30.1 pg (ref 26.6–33.0)
MCHC: 34.4 g/dL (ref 31.5–35.7)
MCV: 88 fL (ref 79–97)
Platelets: 258 10*3/uL (ref 150–450)
RBC: 4.35 x10E6/uL (ref 3.77–5.28)
RDW: 12.4 % (ref 11.7–15.4)
WBC: 9.2 10*3/uL (ref 3.4–10.8)

## 2019-04-25 ENCOUNTER — Telehealth: Payer: Self-pay | Admitting: *Deleted

## 2019-04-25 ENCOUNTER — Telehealth (INDEPENDENT_AMBULATORY_CARE_PROVIDER_SITE_OTHER): Payer: BC Managed Care – PPO | Admitting: Cardiology

## 2019-04-25 ENCOUNTER — Other Ambulatory Visit: Payer: Self-pay

## 2019-04-25 DIAGNOSIS — I480 Paroxysmal atrial fibrillation: Secondary | ICD-10-CM

## 2019-04-25 NOTE — Telephone Encounter (Signed)
Rescheduled Covid screening to after CT testing that same morning. Rescheduled to 2:00 pm at [facility]. Patient verbalized understanding and agreeable to plan.

## 2019-04-25 NOTE — Progress Notes (Signed)
Electrophysiology TeleHealth Note   Due to national recommendations of social distancing due to COVID 19, an audio/video telehealth visit is felt to be most appropriate for this patient at this time.  See Epic message for the patient's consent to telehealth for [facility].   Date:  04/25/2019   ID:  Nicole Hays, DOB 1965/11/08, MRN [id number]  Location: patient's home  Provider location: [address] [place]  Evaluation Performed: Follow-up visit  PCP:  Nicole Rainier, MD  Cardiologist:  [facility] Electrophysiologist:  Dr Elberta Fortis  Chief Complaint:  AF  History of Present Illness:    Nicole Hays is a 53 y.o. female who presents via audio/video conferencing for a telehealth visit today.  Since last being seen in our clinic, the patient reports doing very well.  Today, she denies symptoms of palpitations, chest pain, shortness of breath,  lower extremity edema, dizziness, presyncope, or syncope.  The patient is otherwise without complaint today.  The patient denies symptoms of fevers, chills, cough, or new SOB worrisome for COVID 19.  She has a history of atrial fibrillation.  She is currently on Multaq.  She is scheduled for AF ablation 05/10/2018.  Today, denies symptoms of palpitations, chest pain, shortness of breath, orthopnea, PND, lower extremity edema, claudication, dizziness, presyncope, syncope, bleeding, or neurologic sequela. The patient is tolerating medications without difficulties.    Past Medical History:  Diagnosis Date  . Anxiety   . Asthma    rarely uses inhaler  . Atrial fibrillation ([facility])   . GERD (gastroesophageal reflux disease)   . IBS (irritable bowel syndrome)   . Migraines    last one years ago  . Seasonal allergies   . Shingles 1995    Past Surgical History:  Procedure Laterality Date  . BREAST EXCISIONAL BIOPSY Left    @22  benign  . DILATATION & CURETTAGE/HYSTEROSCOPY WITH MYOSURE N/A 08/11/2015   Procedure:  DILATATION & CURETTAGE/HYSTEROSCOPY WITH MYOSURE;  Surgeon: 10/11/2015, MD;  Location: [facility];  Service: Gynecology;  Laterality: N/A;  . FOOT SURGERY Bilateral    spurs  . NOVASURE ABLATION N/A 08/11/2015   Procedure: NOVASURE ABLATION;  Surgeon: 10/11/2015, MD;  Location: [facility];  Service: Gynecology;  Laterality: N/A;  . WISDOM TOOTH EXTRACTION      Current Outpatient Medications  Medication Sig Dispense Refill  . acetaminophen (TYLENOL) 500 MG tablet Take 500 mg by mouth every 8 (eight) hours as needed.    Jerene Bears albuterol (PROVENTIL HFA;VENTOLIN HFA) 108 (90 Base) MCG/ACT inhaler Inhale into the lungs every 6 (six) hours as needed for wheezing or shortness of breath.    Marland Kitchen apixaban (ELIQUIS) 5 MG TABS tablet Take 1 tablet (5 mg total) by mouth 2 (two) times daily. 60 tablet 3  . apixaban (ELIQUIS) 5 MG TABS tablet Take 1 tablet (5 mg total) by mouth 2 (two) times daily. 60 tablet 0  . Cholecalciferol (VITAMIN D3 PO) Take 25 mcg by mouth daily.    . diphenoxylate-atropine (LOMOTIL) 2.5-0.025 MG per tablet Take by mouth 4 (four) times daily as needed for diarrhea or loose stools.    . Eletriptan Hydrobromide (RELPAX PO) Take 1 tablet by mouth daily as needed (For migraine.).     07-20-1989 loratadine (CLARITIN) 10 MG tablet Take 10 mg by mouth daily.    Marland Kitchen MAGNESIUM PO Take 250 mg by mouth daily.     . Menaquinone-7 (VITAMIN K2 PO) Take 150 mcg by mouth daily.    Marland Kitchen  metoprolol tartrate (LOPRESSOR) 25 MG tablet Take 1 tablet (25 mg total) by mouth 2 (two) times daily. 180 tablet 2  . montelukast (SINGULAIR) 10 MG tablet Take 10 mg by mouth at bedtime.     . MULTAQ 400 MG tablet TAKE 1 TABLET (400 MG TOTAL) BY MOUTH 2 (TWO) TIMES DAILY WITH A MEAL. 180 tablet 1  . Multiple Vitamins-Minerals (EMERGEN-C IMMUNE PLUS PO) Take 1 tablet by mouth daily as needed (To prevent cold.). Reported on 10/23/2015    . ranitidine (ZANTAC) 150 MG tablet Take 150 mg by mouth daily.    . vitamin C (ASCORBIC ACID) 500 MG  tablet Take 500 mg by mouth 2 (two) times daily.      No current facility-administered medications for this visit.    Allergies:   Ciprofloxacin and Penicillins   Social History:  The patient  reports that she has never smoked. She has never used smokeless tobacco. She reports current alcohol use of about 2.0 standard drinks of alcohol per week. She reports that she does not use drugs.   Family History:  The patient's  family history includes Hyperlipidemia in her father; Hypertension in her mother; Osteoporosis in her maternal grandmother; Stroke in her maternal uncle and mother.   ROS:  Please see the history of present illness.   All other systems are personally reviewed and negative.    Exam:    Vital Signs:  Pulse 71   LMP 08/05/2015   no acute distress, no shortness of breath.  Labs/Other Tests and Data Reviewed:    Recent Labs: 12/08/2018: ALT 13; TSH 2.650 04/17/2019: BUN 12; Creatinine, Ser 0.67; Hemoglobin 13.1; Platelets 258; Potassium 3.8; Sodium 144   Wt Readings from Last 3 Encounters:  03/23/19 189 lb 3.2 oz (85.8 kg)  03/08/19 191 lb (86.6 kg)  12/08/18 189 lb (85.7 kg)     Other studies personally reviewed: Additional studies/ records that were reviewed today include: ECG 03/23/2019 personally reviewed Review of the above records today demonstrates: Sinus rhythm, rate 48   ASSESSMENT & PLAN:    1.  Paroxysmal atrial fibrillation: Currently on metoprolol and Multaq.  We Paulena Servais plan for ablation 05/10/2018.  Risks and benefits were discussed.  Risks include bleeding, tamponade, heart block, stroke, damage surrounding organs.  She understands these risks and has agreed to the procedure.   COVID 19 screen The patient denies symptoms of COVID 19 at this time.  The importance of social distancing was discussed today.  Follow-up: 3 months  Current medicines are reviewed at length with the patient today.   The patient does not have concerns regarding her medicines.   The following changes were made today:  none  Labs/ tests ordered today include:  No orders of the defined types were placed in this encounter.    Patient Risk:  after full review of this patients clinical status, I feel that they are at moderate risk at this time.  Today, I have spent 8 minutes with the patient with telehealth technology discussing AF .    Signed, Breana Litts Meredith Leeds, MD  04/25/2019 3:53 PM     [facility] [address] [place] [address] [phone] ([phone] (fax)

## 2019-05-07 ENCOUNTER — Encounter (HOSPITAL_COMMUNITY): Payer: Self-pay

## 2019-05-07 ENCOUNTER — Telehealth (HOSPITAL_COMMUNITY): Payer: Self-pay | Admitting: Emergency Medicine

## 2019-05-07 NOTE — Telephone Encounter (Signed)
Left message on voicemail with name and callback number Mattias Walmsley RN Navigator Cardiac Imaging [facility] [phone] Office [phone] Cell  

## 2019-05-08 ENCOUNTER — Other Ambulatory Visit
Admission: RE | Admit: 2019-05-08 | Discharge: 2019-05-08 | Disposition: A | Discharge status: Home / Self Care | Payer: BC Managed Care – PPO | Source: Ambulatory Visit | Attending: Cardiology | Admitting: Cardiology

## 2019-05-08 ENCOUNTER — Ambulatory Visit (HOSPITAL_COMMUNITY)
Admission: RE | Admit: 2019-05-08 | Discharge: 2019-05-08 | Disposition: A | Discharge status: Home / Self Care | Payer: BC Managed Care – PPO | Source: Ambulatory Visit | Attending: Cardiology | Admitting: Cardiology

## 2019-05-08 ENCOUNTER — Other Ambulatory Visit: Payer: Self-pay

## 2019-05-08 ENCOUNTER — Other Ambulatory Visit: Payer: BC Managed Care – PPO

## 2019-05-08 DIAGNOSIS — I480 Paroxysmal atrial fibrillation: Secondary | ICD-10-CM

## 2019-05-08 DIAGNOSIS — Z01812 Encounter for preprocedural laboratory examination: Secondary | ICD-10-CM | POA: Diagnosis present

## 2019-05-08 DIAGNOSIS — Z20822 Contact with and (suspected) exposure to covid-19: Secondary | ICD-10-CM | POA: Diagnosis not present

## 2019-05-08 MED ORDER — IOHEXOL 350 MG/ML SOLN
80.0000 mL | Freq: Once | INTRAVENOUS | Status: AC | PRN
  Administered 2019-05-08: 80 mL via INTRAVENOUS

## 2019-05-09 LAB — SARS CORONAVIRUS 2 (TAT 6-24 HRS): SARS Coronavirus 2: NEGATIVE

## 2019-05-10 NOTE — Anesthesia Preprocedure Evaluation (Addendum)
Anesthesia Evaluation  Patient identified by MRN, date of birth, ID band Patient awake    Reviewed: Allergy & Precautions, NPO status , Patient's Chart, lab work & pertinent test results, reviewed documented beta blocker date and time   History of Anesthesia Complications Negative for: history of anesthetic complications  Airway Mallampati: II  TM Distance: >3 FB Neck ROM: Full    Dental  (+) Teeth Intact, Dental Advisory Given   Pulmonary asthma ,    Pulmonary exam normal breath sounds clear to auscultation       Cardiovascular Normal cardiovascular exam+ dysrhythmias Atrial Fibrillation  Rhythm:Regular Rate:Normal     Neuro/Psych  Headaches, PSYCHIATRIC DISORDERS Anxiety    GI/Hepatic Neg liver ROS, GERD  Medicated and Controlled,  Endo/Other  Obesity   Renal/GU negative Renal ROS     Musculoskeletal negative musculoskeletal ROS (+)   Abdominal   Peds  Hematology  (+) Blood dyscrasia (Eliquis), ,   Anesthesia Other Findings Day of surgery medications reviewed with the patient.  Reproductive/Obstetrics                            Anesthesia Physical Anesthesia Plan  ASA: III  Anesthesia Plan: General   Post-op Pain Management:    Induction: Intravenous  PONV Risk Score and Plan: 3 and Midazolam, Dexamethasone and Ondansetron  Airway Management Planned: Oral ETT  Additional Equipment:   Intra-op Plan:   Post-operative Plan: Extubation in OR  Informed Consent: I have reviewed the patients History and Physical, chart, labs and discussed the procedure including the risks, benefits and alternatives for the proposed anesthesia with the patient or authorized representative who has indicated his/her understanding and acceptance.     Dental advisory given  Plan Discussed with: CRNA  Anesthesia Plan Comments:        Anesthesia Quick Evaluation

## 2019-05-11 ENCOUNTER — Ambulatory Visit (HOSPITAL_COMMUNITY)
Admission: RE | Admit: 2019-05-11 | Discharge: 2019-05-11 | Disposition: A | Discharge status: Home / Self Care | Payer: BC Managed Care – PPO | Attending: Cardiology | Admitting: Cardiology

## 2019-05-11 ENCOUNTER — Other Ambulatory Visit: Payer: Self-pay

## 2019-05-11 ENCOUNTER — Ambulatory Visit (HOSPITAL_COMMUNITY): Payer: BC Managed Care – PPO | Admitting: Anesthesiology

## 2019-05-11 ENCOUNTER — Encounter (HOSPITAL_COMMUNITY)
Admission: RE | Disposition: A | Discharge status: Home / Self Care | Payer: Self-pay | Source: Home / Self Care | Attending: Cardiology

## 2019-05-11 ENCOUNTER — Encounter (HOSPITAL_COMMUNITY): Payer: Self-pay | Admitting: Cardiology

## 2019-05-11 DIAGNOSIS — Z7982 Long term (current) use of aspirin: Secondary | ICD-10-CM | POA: Diagnosis not present

## 2019-05-11 DIAGNOSIS — K589 Irritable bowel syndrome without diarrhea: Secondary | ICD-10-CM | POA: Insufficient documentation

## 2019-05-11 DIAGNOSIS — I480 Paroxysmal atrial fibrillation: Secondary | ICD-10-CM | POA: Diagnosis present

## 2019-05-11 DIAGNOSIS — Z79899 Other long term (current) drug therapy: Secondary | ICD-10-CM | POA: Diagnosis not present

## 2019-05-11 DIAGNOSIS — F419 Anxiety disorder, unspecified: Secondary | ICD-10-CM | POA: Diagnosis not present

## 2019-05-11 DIAGNOSIS — J45909 Unspecified asthma, uncomplicated: Secondary | ICD-10-CM | POA: Diagnosis not present

## 2019-05-11 DIAGNOSIS — K219 Gastro-esophageal reflux disease without esophagitis: Secondary | ICD-10-CM | POA: Insufficient documentation

## 2019-05-11 HISTORY — PX: ATRIAL FIBRILLATION ABLATION: EP1191

## 2019-05-11 LAB — POCT ACTIVATED CLOTTING TIME
Activated Clotting Time: 279 seconds
Activated Clotting Time: 290 seconds

## 2019-05-11 SURGERY — ATRIAL FIBRILLATION ABLATION
Anesthesia: General

## 2019-05-11 MED ORDER — ROCURONIUM BROMIDE 50 MG/5ML IV SOSY
PREFILLED_SYRINGE | INTRAVENOUS | Status: DC | PRN
  Administered 2019-05-11: 40 mg via INTRAVENOUS
  Administered 2019-05-11: 60 mg via INTRAVENOUS

## 2019-05-11 MED ORDER — DOBUTAMINE IN D5W 4-5 MG/ML-% IV SOLN
INTRAVENOUS | Status: DC | PRN
  Administered 2019-05-11: 20 ug/kg/min via INTRAVENOUS

## 2019-05-11 MED ORDER — ACETAMINOPHEN 500 MG PO TABS
ORAL_TABLET | ORAL | Status: AC
  Filled 2019-05-11: qty 2

## 2019-05-11 MED ORDER — FENTANYL CITRATE (PF) 100 MCG/2ML IJ SOLN
INTRAMUSCULAR | Status: DC | PRN
  Administered 2019-05-11 (×4): 50 ug via INTRAVENOUS

## 2019-05-11 MED ORDER — SUGAMMADEX SODIUM 200 MG/2ML IV SOLN
INTRAVENOUS | Status: DC | PRN
  Administered 2019-05-11: 200 mg via INTRAVENOUS

## 2019-05-11 MED ORDER — SODIUM CHLORIDE 0.9 % IV SOLN: INTRAVENOUS | Status: DC

## 2019-05-11 MED ORDER — ONDANSETRON HCL 4 MG/2ML IJ SOLN: 4.0000 mg | Freq: Four times a day (QID) | INTRAMUSCULAR | Status: DC | PRN

## 2019-05-11 MED ORDER — BUPIVACAINE HCL (PF) 0.25 % IJ SOLN
INTRAMUSCULAR | Status: AC
  Filled 2019-05-11: qty 30

## 2019-05-11 MED ORDER — HEPARIN (PORCINE) IN NACL 1000-0.9 UT/500ML-% IV SOLN
INTRAVENOUS | Status: AC
  Filled 2019-05-11: qty 500

## 2019-05-11 MED ORDER — MIDAZOLAM HCL 5 MG/5ML IJ SOLN
INTRAMUSCULAR | Status: DC | PRN
  Administered 2019-05-11: 2 mg via INTRAVENOUS

## 2019-05-11 MED ORDER — ACETAMINOPHEN 325 MG PO TABS
650.0000 mg | ORAL_TABLET | ORAL | Status: DC | PRN
  Filled 2019-05-11: qty 2

## 2019-05-11 MED ORDER — HEPARIN SODIUM (PORCINE) 1000 UNIT/ML IJ SOLN
INTRAMUSCULAR | Status: DC | PRN
  Administered 2019-05-11: 1000 [IU] via INTRAVENOUS

## 2019-05-11 MED ORDER — HEPARIN (PORCINE) IN NACL 1000-0.9 UT/500ML-% IV SOLN
INTRAVENOUS | Status: DC | PRN
  Administered 2019-05-11 (×5): 500 mL

## 2019-05-11 MED ORDER — LIDOCAINE 2% (20 MG/ML) 5 ML SYRINGE
INTRAMUSCULAR | Status: DC | PRN
  Administered 2019-05-11: 80 mg via INTRAVENOUS

## 2019-05-11 MED ORDER — DOBUTAMINE IN D5W 4-5 MG/ML-% IV SOLN
INTRAVENOUS | Status: AC
  Filled 2019-05-11: qty 250

## 2019-05-11 MED ORDER — PROTAMINE SULFATE 10 MG/ML IV SOLN
INTRAVENOUS | Status: DC | PRN
  Administered 2019-05-11: 50 mg via INTRAVENOUS

## 2019-05-11 MED ORDER — HEPARIN SODIUM (PORCINE) 1000 UNIT/ML IJ SOLN
INTRAMUSCULAR | Status: AC
  Filled 2019-05-11: qty 1

## 2019-05-11 MED ORDER — SODIUM CHLORIDE 0.9 % IV SOLN: 250.0000 mL | INTRAVENOUS | Status: DC | PRN

## 2019-05-11 MED ORDER — ONDANSETRON HCL 4 MG/2ML IJ SOLN
INTRAMUSCULAR | Status: DC | PRN
  Administered 2019-05-11: 4 mg via INTRAVENOUS

## 2019-05-11 MED ORDER — SODIUM CHLORIDE 0.9% FLUSH: 3.0000 mL | INTRAVENOUS | Status: DC | PRN

## 2019-05-11 MED ORDER — HEPARIN SODIUM (PORCINE) 1000 UNIT/ML IJ SOLN
INTRAMUSCULAR | Status: DC | PRN
  Administered 2019-05-11: 4000 [IU] via INTRAVENOUS
  Administered 2019-05-11: 13000 [IU] via INTRAVENOUS

## 2019-05-11 MED ORDER — PROPOFOL 10 MG/ML IV BOLUS
INTRAVENOUS | Status: DC | PRN
  Administered 2019-05-11: 140 mg via INTRAVENOUS

## 2019-05-11 MED ORDER — BUPIVACAINE HCL (PF) 0.25 % IJ SOLN
INTRAMUSCULAR | Status: DC | PRN
  Administered 2019-05-11: 30 mL

## 2019-05-11 MED ORDER — ACETAMINOPHEN 500 MG PO TABS
1000.0000 mg | ORAL_TABLET | Freq: Once | ORAL | Status: AC
  Administered 2019-05-11: 1000 mg via ORAL
  Filled 2019-05-11: qty 2

## 2019-05-11 MED ORDER — DEXAMETHASONE SODIUM PHOSPHATE 10 MG/ML IJ SOLN
INTRAMUSCULAR | Status: DC | PRN
  Administered 2019-05-11: 10 mg via INTRAVENOUS

## 2019-05-11 MED ORDER — EPHEDRINE SULFATE 50 MG/ML IJ SOLN
INTRAMUSCULAR | Status: DC | PRN
  Administered 2019-05-11 (×2): 5 mg via INTRAVENOUS

## 2019-05-11 MED ORDER — SODIUM CHLORIDE 0.9% FLUSH: 3.0000 mL | Freq: Two times a day (BID) | INTRAVENOUS | Status: DC

## 2019-05-11 SURGICAL SUPPLY — 22 items
BLANKET WARM UNDERBOD FULL ACC (MISCELLANEOUS) ×3 IMPLANT
CATH MAPPNG PENTARAY F 2-6-2MM (CATHETERS) ×1 IMPLANT
CATH SMTCH THERMOCOOL SF DF (CATHETERS) ×3 IMPLANT
CATH SOUNDSTAR ECO 8FR (CATHETERS) ×3 IMPLANT
CATH WEBSTER BI DIR CS D-F CRV (CATHETERS) ×3 IMPLANT
COVER SWIFTLINK CONNECTOR (BAG) ×3 IMPLANT
DEVICE CLOSURE PERCLS PRGLD 6F (VASCULAR PRODUCTS) ×4 IMPLANT
PACK EP LATEX FREE (CUSTOM PROCEDURE TRAY) ×2
PACK EP LF (CUSTOM PROCEDURE TRAY) ×1 IMPLANT
PAD PRO RADIOLUCENT 2001M-C (PAD) ×3 IMPLANT
PATCH CARTO3 (PAD) ×3 IMPLANT
PENTARAY F 2-6-2MM (CATHETERS) ×3
PERCLOSE PROGLIDE 6F (VASCULAR PRODUCTS) ×12
SHEATH BAYLIS SUREFLEX  M 8.5 (SHEATH) ×2
SHEATH BAYLIS SUREFLEX M 8.5 (SHEATH) ×1 IMPLANT
SHEATH BAYLIS TRANSSEPTAL 98CM (NEEDLE) ×3 IMPLANT
SHEATH CARTO VIZIGO SM CVD (SHEATH) ×3 IMPLANT
SHEATH PINNACLE 7F 10CM (SHEATH) ×3 IMPLANT
SHEATH PINNACLE 8F 10CM (SHEATH) ×6 IMPLANT
SHEATH PINNACLE 9F 10CM (SHEATH) ×3 IMPLANT
SHEATH PROBE COVER 6X72 (BAG) ×3 IMPLANT
TUBING SMART ABLATE COOLFLOW (TUBING) ×3 IMPLANT

## 2019-05-11 NOTE — H&P (Signed)
Nicole Hays has presented today for surgery, with the diagnosis of atrial fibrillation.  The various methods of treatment have been discussed with the patient and family. After consideration of risks, benefits and other options for treatment, the patient has consented to  Procedure(s): Catheter ablation as a surgical intervention .  Risks include but not limited to bleeding, tamponade, heart block, stroke, damage to surrounding organs, among others. The patient's history has been reviewed, patient examined, no change in status, stable for surgery.  I have reviewed the patient's chart and labs.  Questions were answered to the patient's satisfaction.    Litsy Epting Elberta Fortis, MD 05/11/2019 7:06 AM

## 2019-05-11 NOTE — Anesthesia Postprocedure Evaluation (Signed)
Anesthesia Post Note  Patient: Nicole Hays  Procedure(s) Performed: ATRIAL FIBRILLATION ABLATION (N/A )     Patient location during evaluation: PACU Anesthesia Type: General Level of consciousness: awake and alert Pain management: pain level controlled Vital Signs Assessment: post-procedure vital signs reviewed and stable Respiratory status: spontaneous breathing, nonlabored ventilation, respiratory function stable and patient connected to nasal cannula oxygen Cardiovascular status: blood pressure returned to baseline and stable Postop Assessment: no apparent nausea or vomiting Anesthetic complications: no    Last Vitals:  Vitals:   05/11/19 1245 05/11/19 1250  BP: 128/77 128/77  Pulse: (!) 102 100  Resp: 20 19  Temp:    SpO2: 93% 95%    Last Pain:  Vitals:   05/11/19 1108  TempSrc: Temporal  PainSc:                  Cecile Hearing

## 2019-05-11 NOTE — Transfer of Care (Signed)
Immediate Anesthesia Transfer of Care Note  Patient: Nicole Hays  Procedure(s) Performed: ATRIAL FIBRILLATION ABLATION (N/A )  Patient Location: Cath Lab  Anesthesia Type:General  Level of Consciousness: awake, alert , oriented and patient cooperative  Airway & Oxygen Therapy: Patient Spontanous Breathing and Patient connected to nasal cannula oxygen  Post-op Assessment: Report given to RN and Post -op Vital signs reviewed and stable  Post vital signs: Reviewed and stable  Last Vitals:  Vitals Value Taken Time  BP 120/67 05/11/19 1045  Temp 36.1 C 05/11/19 1026  Pulse 90 05/11/19 1053  Resp 16 05/11/19 1053  SpO2 94 % 05/11/19 1053  Vitals shown include unvalidated device data.  Last Pain:  Vitals:   05/11/19 1026  TempSrc: Temporal  PainSc: 0-No pain         Complications: No apparent anesthesia complications

## 2019-05-11 NOTE — Discharge Instructions (Signed)
Post procedure care instructions No driving for 4 days. No lifting over 5 lbs for 1 week. No vigorous or sexual activity for 1 week. You may return to work/your usual activities on 05/17/2018. Keep procedure site clean & dry. If you notice increased pain, swelling, bleeding or pus, call/return!  You may shower, but no soaking baths/hot tubs/pools for 1 week.      Cardiac Ablation, Care After This sheet gives you information about how to care for yourself after your procedure. Your health care provider may also give you more specific instructions. If you have problems or questions, contact your health care provider. What can I expect after the procedure? After the procedure, it is common to have:  Bruising around your puncture site.  Tenderness around your puncture site.  Skipped heartbeats.  Tiredness (fatigue). Follow these instructions at home: Puncture site care   Follow instructions from your health care provider about how to take care of your puncture site. Make sure you: ? Wash your hands with soap and water before you change your bandage (dressing). If soap and water are not available, use hand sanitizer. ? Change your dressing as told by your health care provider. ? Leave stitches (sutures), skin glue, or adhesive strips in place. These skin closures may need to stay in place for up to 2 weeks. If adhesive strip edges start to loosen and curl up, you may trim the loose edges. Do not remove adhesive strips completely unless your health care provider tells you to do that.  Check your puncture site every day for signs of infection. Check for: ? Redness, swelling, or pain. ? Fluid or blood. If your puncture site starts to bleed, lie down on your back, apply firm pressure to the area, and contact your health care provider. ? Warmth. ? Pus or a bad smell. Driving  Ask your health care provider when it is safe for you to drive again after the procedure.  Do not drive or use heavy  machinery while taking prescription pain medicine.  Do not drive for 24 hours if you were given a medicine to help you relax (sedative) during your procedure. Activity  Avoid activities that take a lot of effort for at least 3 days after your procedure.  Do not lift anything that is heavier than 10 lb (4.5 kg), or the limit that you are told, until your health care provider says that it is safe.  Return to your normal activities as told by your health care provider. Ask your health care provider what activities are safe for you. General instructions  Take over-the-counter and prescription medicines only as told by your health care provider.  Do not use any products that contain nicotine or tobacco, such as cigarettes and e-cigarettes. If you need help quitting, ask your health care provider.  Do not take baths, swim, or use a hot tub until your health care provider approves.  Do not drink alcohol for 24 hours after your procedure.  Keep all follow-up visits as told by your health care provider. This is important. Contact a health care provider if:  You have redness, mild swelling, or pain around your puncture site.  You have fluid or blood coming from your puncture site that stops after applying firm pressure to the area.  Your puncture site feels warm to the touch.  You have pus or a bad smell coming from your puncture site.  You have a fever.  You have chest pain or discomfort that spreads  to your neck, jaw, or arm.  You are sweating a lot.  You feel nauseous.  You have a fast or irregular heartbeat.  You have shortness of breath.  You are dizzy or light-headed and feel the need to lie down.  You have pain or numbness in the arm or leg closest to your puncture site. Get help right away if:  Your puncture site suddenly swells.  Your puncture site is bleeding and the bleeding does not stop after applying firm pressure to the area. These symptoms may represent a  serious problem that is an emergency. Do not wait to see if the symptoms will go away. Get medical help right away. Call your local emergency services (911 in the [place] Do not drive yourself to the hospital. Summary  After the procedure, it is normal to have bruising and tenderness at the puncture site in your groin, neck, or forearm.  Check your puncture site every day for signs of infection.  Get help right away if your puncture site is bleeding and the bleeding does not stop after applying firm pressure to the area. This is a medical emergency. This information is not intended to replace advice given to you by your health care provider. Make sure you discuss any questions you have with your health care provider. Document Revised: 04/01/2017 Document Reviewed: 07/29/2016 Elsevier Patient Education  2020 [employer].   You have an appointment set up with the Atrial Fibrillation Clinic.  Multiple studies have shown that being followed by a dedicated atrial fibrillation clinic in addition to the standard care you receive from your other physicians improves health. We believe that enrollment in the atrial fibrillation clinic will allow [place] to better care for you.   The phone number to the Atrial Fibrillation Clinic is [phone]. The clinic is staffed Monday through Friday from 8:30am to 5pm.  Parking Directions: The clinic is located in the Heart and Vascular Building connected to [facility]. 1)From [address] turn on to [address] and go to the 3rd entrance  (Heart and Vascular entrance) on the right. 2)Look to the right for Heart &Vascular Parking Garage. 3)A code for the entrance is required please call the clinic to receive this.   4)Take the elevators to the 1st floor. Registration is in the room with the glass walls at the end of the hallway.  If you have any trouble parking or locating the clinic, please don't hesitate to call [phone].

## 2019-05-14 ENCOUNTER — Other Ambulatory Visit: Payer: Self-pay | Admitting: Cardiology

## 2019-06-08 ENCOUNTER — Other Ambulatory Visit: Payer: Self-pay

## 2019-06-08 ENCOUNTER — Ambulatory Visit (HOSPITAL_COMMUNITY)
Admission: RE | Admit: 2019-06-08 | Discharge: 2019-06-08 | Disposition: A | Discharge status: Home / Self Care | Payer: BC Managed Care – PPO | Source: Ambulatory Visit | Attending: Nurse Practitioner | Admitting: Nurse Practitioner

## 2019-06-08 ENCOUNTER — Encounter (HOSPITAL_COMMUNITY): Payer: Self-pay | Admitting: Physician Assistant

## 2019-06-08 ENCOUNTER — Ambulatory Visit (HOSPITAL_COMMUNITY): Payer: BC Managed Care – PPO | Admitting: Nurse Practitioner

## 2019-06-08 VITALS — BP 130/80 | HR 65 | Ht 63.0 in | Wt 192.6 lb

## 2019-06-08 DIAGNOSIS — Z79899 Other long term (current) drug therapy: Secondary | ICD-10-CM | POA: Insufficient documentation

## 2019-06-08 DIAGNOSIS — Z881 Allergy status to other antibiotic agents status: Secondary | ICD-10-CM | POA: Insufficient documentation

## 2019-06-08 DIAGNOSIS — Z6834 Body mass index (BMI) 34.0-34.9, adult: Secondary | ICD-10-CM | POA: Insufficient documentation

## 2019-06-08 DIAGNOSIS — K219 Gastro-esophageal reflux disease without esophagitis: Secondary | ICD-10-CM | POA: Diagnosis not present

## 2019-06-08 DIAGNOSIS — Z7901 Long term (current) use of anticoagulants: Secondary | ICD-10-CM | POA: Diagnosis not present

## 2019-06-08 DIAGNOSIS — E669 Obesity, unspecified: Secondary | ICD-10-CM | POA: Diagnosis not present

## 2019-06-08 DIAGNOSIS — I4891 Unspecified atrial fibrillation: Secondary | ICD-10-CM | POA: Diagnosis present

## 2019-06-08 DIAGNOSIS — Z880 Allergy status to penicillin: Secondary | ICD-10-CM | POA: Insufficient documentation

## 2019-06-08 DIAGNOSIS — K589 Irritable bowel syndrome without diarrhea: Secondary | ICD-10-CM | POA: Diagnosis not present

## 2019-06-08 DIAGNOSIS — Z823 Family history of stroke: Secondary | ICD-10-CM | POA: Insufficient documentation

## 2019-06-08 DIAGNOSIS — J45909 Unspecified asthma, uncomplicated: Secondary | ICD-10-CM | POA: Insufficient documentation

## 2019-06-08 DIAGNOSIS — Z8249 Family history of ischemic heart disease and other diseases of the circulatory system: Secondary | ICD-10-CM | POA: Diagnosis not present

## 2019-06-08 DIAGNOSIS — I480 Paroxysmal atrial fibrillation: Secondary | ICD-10-CM | POA: Insufficient documentation

## 2019-06-08 NOTE — Progress Notes (Signed)
Primary Care Physician: Juluis Rainier, MD Primary Cardiologist: Dr Dulce Sellar Primary Electrophysiologist: Dr Elberta Fortis Referring Physician: Dr Tomi Likens Nicole Hays is a 54 y.o. female with a history of IBS, asthma, and paroxysmal atrial fibrillation who presents for follow up in the [facility].  The patient was initially diagnosed with atrial fibrillation in 2018 and is on Eliquis for a CHADS2VASC score of 1. Patient was maintained on Multaq but began having more frequent episodes of afib during 2020. She underwent afib ablation with Dr Elberta Fortis on 05/11/19. Patient reports that she has done well since her procedure with only rare, brief palpitations. She denies CP, swallowing, or groin issues.  Today, she denies symptoms of chest pain, shortness of breath, orthopnea, PND, lower extremity edema, dizziness, presyncope, syncope, snoring, daytime somnolence, bleeding, or neurologic sequela. The patient is tolerating medications without difficulties and is otherwise without complaint today.    Atrial Fibrillation Risk Factors:  she does not have symptoms or diagnosis of sleep apnea. she does not have a history of rheumatic fever. she does not have a history of alcohol use.   she has a BMI of Body mass index is 34.12 kg/m.Marland Kitchen Filed Weights   06/08/19 0832  Weight: 87.4 kg    Family History  Problem Relation Age of Onset  . Osteoporosis Maternal Grandmother   . Hypertension Mother   . Stroke Mother   . Hyperlipidemia Father   . Stroke Maternal Uncle      Atrial Fibrillation Management history:  Previous antiarrhythmic drugs: Multaq Previous cardioversions: none Previous ablations: 05/11/19 CHADS2VASC score: 1 Anticoagulation history: Eliquis   Past Medical History:  Diagnosis Date  . Anxiety   . Asthma    rarely uses inhaler  . Atrial fibrillation (HCC)   . GERD (gastroesophageal reflux disease)   . IBS (irritable bowel syndrome)   .  Migraines    last one years ago  . Seasonal allergies   . Shingles 1995   Past Surgical History:  Procedure Laterality Date  . ATRIAL FIBRILLATION ABLATION N/A 05/11/2019   Procedure: ATRIAL FIBRILLATION ABLATION;  Surgeon: Regan Lemming, MD;  Location: [facility] INVASIVE CV LAB;  Service: Cardiovascular;  Laterality: N/A;  . BREAST EXCISIONAL BIOPSY Left    @22  benign  . DILATATION & CURETTAGE/HYSTEROSCOPY WITH MYOSURE N/A 08/11/2015   Procedure: DILATATION & CURETTAGE/HYSTEROSCOPY WITH MYOSURE;  Surgeon: 10/11/2015, MD;  Location: [facility] ORS;  Service: Gynecology;  Laterality: N/A;  . FOOT SURGERY Bilateral    spurs  . NOVASURE ABLATION N/A 08/11/2015   Procedure: NOVASURE ABLATION;  Surgeon: 10/11/2015, MD;  Location: [facility] ORS;  Service: Gynecology;  Laterality: N/A;  . WISDOM TOOTH EXTRACTION      Current Outpatient Medications  Medication Sig Dispense Refill  . acetaminophen (TYLENOL) 500 MG tablet Take 500-1,000 mg by mouth every 6 (six) hours as needed (for pain.).     Jerene Bears albuterol (PROVENTIL HFA;VENTOLIN HFA) 108 (90 Base) MCG/ACT inhaler Inhale into the lungs every 6 (six) hours as needed for wheezing or shortness of breath.    Marland Kitchen apixaban (ELIQUIS) 5 MG TABS tablet Take 1 tablet (5 mg total) by mouth 2 (two) times daily. 60 tablet 3  . diphenoxylate-atropine (LOMOTIL) 2.5-0.025 MG per tablet Take 1 tablet by mouth 4 (four) times daily as needed for diarrhea or loose stools (IBS).     07-20[birth year]1989 eletriptan (RELPAX) 40 MG tablet Take 40 mg by mouth every 2 (two) hours as needed  for migraine or headache. May repeat in 2 hours if headache persists or recurs.    Marland Kitchen loratadine (CLARITIN) 10 MG tablet Take 10 mg by mouth daily.    . Magnesium 250 MG TABS Take 250 mg by mouth every evening.    . metoprolol tartrate (LOPRESSOR) 25 MG tablet TAKE 1 TABLET BY MOUTH TWICE A DAY 180 tablet 2  . montelukast (SINGULAIR) 10 MG tablet Take 10 mg by mouth at bedtime.     . MULTAQ 400 MG tablet TAKE 1 TABLET  (400 MG TOTAL) BY MOUTH 2 (TWO) TIMES DAILY WITH A MEAL. (Patient taking differently: Take 400 mg by mouth 2 (two) times daily. ) 180 tablet 1  . Multiple Vitamins-Minerals (EMERGEN-C IMMUNE PLUS PO) Take 1 tablet by mouth daily as needed (To prevent cold.). Reported on 10/23/2015    . neomycin-bacitracin-polymyxin (NEOSPORIN) ointment Apply 1 application topically 2 (two) times daily as needed for wound care.    . ranitidine (ZANTAC) 150 MG tablet Take 150 mg by mouth daily.    . vitamin C (ASCORBIC ACID) 500 MG tablet Take 500 mg by mouth 2 (two) times daily.     . Vitamin D3 (VITAMIN D) 25 MCG tablet Take 1,000 Units by mouth every evening.     No current facility-administered medications for this encounter.    Allergies  Allergen Reactions  . Ciprofloxacin Nausea And Vomiting  . Penicillins Other (See Comments)    Did it involve swelling of the face/tongue/throat, SOB, or low BP? Unknown Did it involve sudden or severe rash/hives, skin peeling, or any reaction on the inside of your mouth or nose? Unknown Did you need to seek medical attention at a hospital or doctor's office? Unknown When did it last happen?childhood reaction. If all above answers are "NO", may proceed with cephalosporin use. Unknown childhood reaction.    Social History   Socioeconomic History  . Marital status: Married    Spouse name: Not on file  . Number of children: Not on file  . Years of education: Not on file  . Highest education level: Not on file  Occupational History  . Not on file  Tobacco Use  . Smoking status: Never Smoker  . Smokeless tobacco: Never Used  Substance and Sexual Activity  . Alcohol use: Yes    Alcohol/week: 2.0 standard drinks    Types: 2 Glasses of wine per week    Comment: occ  . Drug use: No  . Sexual activity: Yes    Partners: Male    Birth control/protection: None    Comment: Husband has vasectomy  Other Topics Concern  . Not on file  Social History Narrative  . Not  on file   Social Determinants of Health   Financial Resource Strain:   . Difficulty of Paying Living Expenses: Not on file  Food Insecurity:   . Worried About Charity fundraiser in the Last Year: Not on file  . Ran Out of Food in the Last Year: Not on file  Transportation Needs:   . Lack of Transportation (Medical): Not on file  . Lack of Transportation (Non-Medical): Not on file  Physical Activity:   . Days of Exercise per Week: Not on file  . Minutes of Exercise per Session: Not on file  Stress:   . Feeling of Stress : Not on file  Social Connections:   . Frequency of Communication with Friends and Family: Not on file  . Frequency of Social Gatherings with Friends  and Family: Not on file  . Attends Religious Services: Not on file  . Active Member of Clubs or Organizations: Not on file  . Attends Banker Meetings: Not on file  . Marital Status: Not on file  Intimate Partner Violence:   . Fear of Current or Ex-Partner: Not on file  . Emotionally Abused: Not on file  . Physically Abused: Not on file  . Sexually Abused: Not on file     ROS- All systems are reviewed and negative except as per the HPI above.  Physical Exam: Vitals:   06/08/19 0832  BP: 130/80  Pulse: 65  Weight: 87.4 kg  Height: 5\' 3"  (1.6 m)    GEN- The patient is well appearing obese female, alert and oriented x 3 today.   Head- normocephalic, atraumatic Eyes-  Sclera clear, conjunctiva pink Ears- hearing intact Oropharynx- clear Neck- supple  Lungs- Clear to ausculation bilaterally, normal work of breathing Heart- Regular rate and rhythm, no murmurs, rubs or gallops  GI- soft, NT, ND, + BS Extremities- no clubbing, cyanosis, or edema MS- no significant deformity or atrophy Skin- no rash or lesion Psych- euthymic mood, full affect Neuro- strength and sensation are intact  Wt Readings from Last 3 Encounters:  06/08/19 87.4 kg  05/11/19 86.2 kg  03/23/19 85.8 kg    EKG today  demonstrates SR HR 65, 1st degree AV block, LAFB, PR 228, QRS 84, QTc 405  Echo 03/29/17 demonstrated  Left ventricle: The cavity size was normal. Systolic function was  normal. The estimated ejection fraction was in the range of 55%  to 65%. Wall motion was normal; there were no regional wall  motion abnormalities. Left ventricular diastolic function  parameters were normal.  - Aortic valve: Valve area (VTI): 2.35 cm^2. Valve area (Vmax):  2.47 cm^2. Valve area (Vmean): 2.41 cm^2.   Impressions:   - Normal LVEF.  Normal Diastolic function.  Trace MR.   Epic records are reviewed at length today  CHA2DS2-VASc Score = 1 The patient's score is based upon: CHF History: No HTN History: No Age : < 65 Diabetes History: No Stroke History: No Vascular Disease History: No Gender: Female      ASSESSMENT AND PLAN: Paroxysmal Atrial Fibrillation (ICD10:  I48.0) The patient's CHA2DS2-VASc score is 1, indicating a 0.6% annual risk of stroke.   S/p afib ablation 05/11/19 with Dr 07/09/19. Patient appears to be maintaining SR. Continue Eliquis 5 mg BID for at least 3 months post ablation with no missed doses. Continue Multaq 400 mg BID   2. Obesity Body mass index is 34.12 kg/m. Lifestyle modification was discussed at length including regular exercise and weight reduction. Patient slowly increasing her activity.   Follow up with Dr Elberta Fortis as scheduled.    Elberta Fortis PA-C Afib Clinic [facility] [address] [place], [place] [phone] 06/08/2019 9:00 AM

## 2019-06-30 ENCOUNTER — Other Ambulatory Visit: Payer: Self-pay

## 2019-06-30 ENCOUNTER — Ambulatory Visit: Payer: BC Managed Care – PPO | Attending: Internal Medicine

## 2019-06-30 DIAGNOSIS — Z23 Encounter for immunization: Secondary | ICD-10-CM | POA: Insufficient documentation

## 2019-06-30 NOTE — Progress Notes (Signed)
   Covid-19 Vaccination Clinic  Name:  Nicole Hays    MRN: [id number] DOB: 04-19-1966  06/30/2019  [name] was observed post Covid-19 immunization for 15 minutes without incidence. She was provided with Vaccine Information Sheet and instruction to access the V-Safe system.   [name] was instructed to call 911 with any severe reactions post vaccine: Marland Kitchen Difficulty breathing  . Swelling of your face and throat  . A fast heartbeat  . A bad rash all over your body  . Dizziness and weakness    Immunizations Administered    Name Date Dose VIS Date Route   Moderna COVID-19 Vaccine 06/30/2019 10:19 AM 0.5 mL 04/03/2019 Intramuscular   Manufacturer: Moderna   Lot: [id number]   NDC: 69861-483-07

## 2019-07-28 ENCOUNTER — Ambulatory Visit: Payer: BC Managed Care – PPO | Attending: Internal Medicine

## 2019-07-28 DIAGNOSIS — Z23 Encounter for immunization: Secondary | ICD-10-CM

## 2019-07-28 NOTE — Progress Notes (Signed)
   Covid-19 Vaccination Clinic  Name:  Nicole Hays    MRN: [id number] DOB: 1966-03-27  07/28/2019  [name] was observed post Covid-19 immunization for 15 minutes without incident. She was provided with Vaccine Information Sheet and instruction to access the V-Safe system.   [name] was instructed to call 911 with any severe reactions post vaccine: Marland Kitchen Difficulty breathing  . Swelling of face and throat  . A fast heartbeat  . A bad rash all over body  . Dizziness and weakness   Immunizations Administered    Name Date Dose VIS Date Route   Moderna COVID-19 Vaccine 07/28/2019  2:00 PM 0.5 mL 04/03/2019 Intramuscular   Manufacturer: Gala Murdoch   Lot: [id number]   NDC: 34193-790-24

## 2019-07-31 ENCOUNTER — Ambulatory Visit: Payer: BC Managed Care – PPO

## 2019-08-10 ENCOUNTER — Other Ambulatory Visit: Payer: Self-pay | Admitting: Cardiology

## 2019-08-13 ENCOUNTER — Encounter: Payer: Self-pay | Admitting: Cardiology

## 2019-08-13 ENCOUNTER — Ambulatory Visit: Payer: BC Managed Care – PPO | Admitting: Cardiology

## 2019-08-13 ENCOUNTER — Other Ambulatory Visit: Payer: Self-pay

## 2019-08-13 VITALS — BP 124/84 | HR 67 | Ht 63.0 in | Wt 189.6 lb

## 2019-08-13 DIAGNOSIS — I480 Paroxysmal atrial fibrillation: Secondary | ICD-10-CM

## 2019-08-13 NOTE — Patient Instructions (Signed)
Medication Instructions:  Your physician has recommended you make the following change in your medication:  1. STOP Multaq 2. STOP Eliquis  *If you need a refill on your cardiac medications before your next appointment, please call your pharmacy*   Lab Work: None ordered If you have labs (blood work) drawn today and your tests are completely normal, you will receive your results only by: Marland Kitchen MyChart Message (if you have MyChart) OR . A paper copy in the mail If you have any lab test that is abnormal or we need to change your treatment, we will call you to review the results.   Testing/Procedures: None ordered   Follow-Up: At [facility], you and your health needs are our priority.  As part of our continuing mission to provide you with exceptional heart care, we have created designated Provider Care Teams.  These Care Teams include your primary Cardiologist (physician) and Advanced Practice Providers (APPs -  Physician Assistants and Nurse Practitioners) who all work together to provide you with the care you need, when you need it.  We recommend signing up for the patient portal called "MyChart".  Sign up information is provided on this After Visit Summary.  MyChart is used to connect with patients for Virtual Visits (Telemedicine).  Patients are able to view lab/test results, encounter notes, upcoming appointments, etc.  Non-urgent messages can be sent to your provider as well.   To learn more about what you can do with MyChart, go to [email or web address].    Your next appointment:   3 month(s)  The format for your next appointment:   In Person  Provider:   Loman Brooklyn, MD   Thank you for choosing [facility]!!   Dory Horn, RN [phone]    Other Instructions

## 2019-08-13 NOTE — Progress Notes (Signed)
Electrophysiology Office Note   Date:  08/13/2019   ID:  Nicole Hays, DOB [birth year], MRN [id number]  PCP:  Leighton Ruff, MD  Cardiologist: Bettina Gavia Primary Electrophysiologist:  Brinklee Cisse Meredith Leeds, MD    Chief Complaint: Atrial fibrillation   History of Present Illness: Nicole Hays is a 54 y.o. female who is being seen today for the evaluation of atrial fibrillation at the request of Leighton Ruff, MD. Presenting today for electrophysiology evaluation.  She has a history significant for asthma and IBS.  She also has paroxysmal atrial fibrillation and is currently on Multaq.  She had a 6-hour episode of AF 11/24/2018.  This was documented on her apple watch.  Otherwise she has done well.  Was initially diagnosed with atrial fibrillation in 2018.  She was initially on Multaq, but was continuing to have episodes.  She is now status post AF ablation on 05/11/2019.  Today, denies symptoms of palpitations, chest pain, shortness of breath, orthopnea, PND, lower extremity edema, claudication, dizziness, presyncope, syncope, bleeding, or neurologic sequela. The patient is tolerating medications without difficulties.  Since ablation she has done well.  She has noticed no further episodes of atrial fibrillation.  She is able to do all of her daily activities without restriction.  Past Medical History:  Diagnosis Date  . Anxiety   . Asthma    rarely uses inhaler  . Atrial fibrillation ([place])   . GERD (gastroesophageal reflux disease)   . IBS (irritable bowel syndrome)   . Migraines    last one years ago  . Seasonal allergies   . Shingles 1995   Past Surgical History:  Procedure Laterality Date  . ATRIAL FIBRILLATION ABLATION N/A 05/11/2019   Procedure: ATRIAL FIBRILLATION ABLATION;  Surgeon: Constance Haw, MD;  Location: [facility];  Service: Cardiovascular;  Laterality: N/A;  . BREAST EXCISIONAL BIOPSY Left    @22  benign  . DILATATION &  CURETTAGE/HYSTEROSCOPY WITH MYOSURE N/A 08/11/2015   Procedure: DILATATION & CURETTAGE/HYSTEROSCOPY WITH MYOSURE;  Surgeon: Megan Salon, MD;  Location: [facility];  Service: Gynecology;  Laterality: N/A;  . FOOT SURGERY Bilateral    spurs  . NOVASURE ABLATION N/A 08/11/2015   Procedure: NOVASURE ABLATION;  Surgeon: Megan Salon, MD;  Location: [facility];  Service: Gynecology;  Laterality: N/A;  . WISDOM TOOTH EXTRACTION       Current Outpatient Medications  Medication Sig Dispense Refill  . acetaminophen (TYLENOL) 500 MG tablet Take 500-1,000 mg by mouth every 6 (six) hours as needed (for pain.).     Marland Kitchen albuterol (PROVENTIL HFA;VENTOLIN HFA) 108 (90 Base) MCG/ACT inhaler Inhale into the lungs every 6 (six) hours as needed for wheezing or shortness of breath.    . diphenoxylate-atropine (LOMOTIL) 2.5-0.025 MG per tablet Take 1 tablet by mouth 4 (four) times daily as needed for diarrhea or loose stools (IBS).     Marland Kitchen eletriptan (RELPAX) 40 MG tablet Take 40 mg by mouth every 2 (two) hours as needed for migraine or headache. May repeat in 2 hours if headache persists or recurs.    Marland Kitchen loratadine (CLARITIN) 10 MG tablet Take 10 mg by mouth daily.    . Magnesium 250 MG TABS Take 250 mg by mouth every evening.    . metoprolol tartrate (LOPRESSOR) 25 MG tablet TAKE 1 TABLET BY MOUTH TWICE A DAY 180 tablet 2  . montelukast (SINGULAIR) 10 MG tablet Take 10 mg by mouth at bedtime.     . Multiple Vitamins-Minerals (  EMERGEN-C IMMUNE PLUS PO) Take 1 tablet by mouth daily as needed (To prevent cold.). Reported on 10/23/2015    . neomycin-bacitracin-polymyxin (NEOSPORIN) ointment Apply 1 application topically 2 (two) times daily as needed for wound care.    . ranitidine (ZANTAC) 150 MG tablet Take 150 mg by mouth daily.    . vitamin C (ASCORBIC ACID) 500 MG tablet Take 500 mg by mouth 2 (two) times daily.     . Vitamin D3 (VITAMIN D) 25 MCG tablet Take 1,000 Units by mouth every evening.     No current  facility-administered medications for this visit.    Allergies:   Ciprofloxacin and Penicillins   Social History:  The patient  reports that she has never smoked. She has never used smokeless tobacco. She reports current alcohol use of about 2.0 standard drinks of alcohol per week. She reports that she does not use drugs.   Family History:  The patient's family history includes Hyperlipidemia in her father; Hypertension in her mother; Osteoporosis in her maternal grandmother; Stroke in her maternal uncle and mother.    ROS:  Please see the history of present illness.   Otherwise, review of systems is positive for none.   All other systems are reviewed and negative.   PHYSICAL EXAM: VS:  BP 124/84   Pulse 67   Ht 5\' 3"  (1.6 m)   Wt 189 lb 9.6 oz (86 kg)   LMP 08/05/2015   SpO2 97%   BMI 33.59 kg/m  , BMI Body mass index is 33.59 kg/m. GEN: Well nourished, well developed, in no acute distress  HEENT: normal  Neck: no JVD, carotid bruits, or masses Cardiac: RRR; no murmurs, rubs, or gallops,no edema  Respiratory:  clear to auscultation bilaterally, normal work of breathing GI: soft, nontender, nondistended, + BS MS: no deformity or atrophy  Skin: warm and dry Neuro:  Strength and sensation are intact Psych: euthymic mood, full affect  EKG:  EKG is ordered today. Personal review of the ekg ordered shows sinus rhythm, rate 62  Recent Labs: 12/08/2018: ALT 13; TSH 2.650 04/17/2019: BUN 12; Creatinine, Ser 0.67; Hemoglobin 13.1; Platelets 258; Potassium 3.8; Sodium 144    Lipid Panel  No results found for: CHOL, TRIG, HDL, CHOLHDL, VLDL, LDLCALC, LDLDIRECT   Wt Readings from Last 3 Encounters:  08/13/19 189 lb 9.6 oz (86 kg)  06/08/19 192 lb 9.6 oz (87.4 kg)  05/11/19 190 lb (86.2 kg)      Other studies Reviewed: Additional studies/ records that were reviewed today include: TTE 03/30/19  Review of the above records today demonstrates:  - Left ventricle: The cavity size  was normal. Systolic function was   normal. The estimated ejection fraction was in the range of 55%   to 65%. Wall motion was normal; there were no regional wall   motion abnormalities. Left ventricular diastolic function   parameters were normal. - Aortic valve: Valve area (VTI): 2.35 cm^2. Valve area (Vmax):   2.47 cm^2. Valve area (Vmean): 2.41 cm^2.   ASSESSMENT AND PLAN:  1.  Paroxysmal atrial fibrillation: Currently on metoprolol, Multaq, flecainide.  Status post ablation 05/11/2019.  CHA2DS2-VASc of 1.  She has had no further episodes of atrial fibrillation since ablation.  She remains in sinus rhythm today.  Due to her low stroke risk, we Zayvian Mcmurtry stop Eliquis.  She would also like to stop Multaq as she has not had any further episodes of atrial fibrillation.    Current medicines are reviewed at  length with the patient today.   The patient does not have concerns regarding her medicines.  The following changes were made today: Stop Eliquis, Multaq  Labs/ tests ordered today include:  Orders Placed This Encounter  Procedures  . EKG 12-Lead     Disposition:   FU with Berdina Cheever 3 months  Signed, Veleka Djordjevic Jorja Loa, MD  08/13/2019 9:53 AM     [facility] [address] [place] [address] [phone] (office) [phone] (fax)'

## 2019-09-15 ENCOUNTER — Other Ambulatory Visit: Payer: Self-pay | Admitting: Cardiology

## 2019-09-28 ENCOUNTER — Telehealth (HOSPITAL_COMMUNITY): Payer: Self-pay | Admitting: Physician Assistant

## 2019-09-28 NOTE — Telephone Encounter (Signed)
Patient called AF clinic feeling she was back in afib with symptoms of palpitations and dizziness since 7 pm on 09/07/19. Her heart rate has been upper 80s-low 100s. There were no specific triggers that she could identify although she does admit she has been under a significant amount of stress recently. Will increase Lopressor to 50 mg BID and have patient call back 10/02/19 with update.

## 2019-10-26 ENCOUNTER — Telehealth: Payer: Self-pay | Admitting: *Deleted

## 2019-10-26 NOTE — Telephone Encounter (Signed)
   [facility] Pre-operative Risk Assessment    Request for surgical clearance:  1. What type of surgery is being performed? Colonoscopy/history of adenomatous polyp of colon.  2. When is this surgery scheduled? 12/03/19   3. What type of clearance is required (medical clearance vs. Pharmacy clearance to hold med vs. Both)? Both  4. Are there any medications that need to be held prior to surgery and how long? NA  5. Practice name and name of physician performing surgery? [facility], Dr. Paulita Fujita   6. What is your office phone number [phone]    7.   What is your office fax number [phone]  8.   Anesthesia type (None, local, MAC, general) ? Not stated   Nicole Hays 10/26/2019, 2:54 PM  _________________________________________________________________   (provider comments below)

## 2019-10-26 NOTE — Telephone Encounter (Signed)
Left message for the patient to call back and speak to the on call preop APP. Patient had reassuring coronary CT in the past, no sign of CAD. She underwent afib ablation and later came off of Eliquis.   As long as she has no recurrent of afib or palpitation, she should be cleared for colonoscopy.

## 2019-10-29 NOTE — Telephone Encounter (Signed)
   Primary Cardiologist: Norman Herrlich, MD (EP - Dr. Loman Brooklyn)  Chart reviewed as part of pre-operative protocol coverage. Patient was contacted 10/29/2019 in reference to pre-operative risk assessment for pending surgery as outlined below.  Nicole Hays was last seen on 08/13/19 by Dr. Elberta Fortis. She has history of PAF, asthma, IBS, anxiety, GERD, migraines, seasonal allergies. She is s/p ablation 05/2019 and is now off Eliquis. 2D echo in 2018 showed normal LVEF. Coronary CT 05/2019 in prep for ablation showed calcium score of 0 with no CAD in proximal segments. I spoke with the patient who affirms she is doing well. She indicates she spoke with afib clinic back in May for some breakthrough palpitations that ultimately were found to be in the setting of a viral infection. Once the viral infection resolved, her palpitations totally resolved and she's felt great since that time. Therefore, based on ACC/AHA guidelines, the patient would be at acceptable risk for the planned procedure without further cardiovascular testing.   I will route this recommendation to the requesting party via Epic fax function and remove from pre-op pool. Please call with questions. I will also route to Jorja Loa as FYI for follow-up to that May 2021 phone note. She is aware to reach out to our office if she has any breakthrough symptoms between now and time of procedure.  Laurann Montana, PA-C 10/29/2019, 2:38 PM

## 2019-10-31 ENCOUNTER — Other Ambulatory Visit: Payer: Self-pay | Admitting: Obstetrics & Gynecology

## 2019-10-31 DIAGNOSIS — Z1231 Encounter for screening mammogram for malignant neoplasm of breast: Secondary | ICD-10-CM

## 2019-12-06 ENCOUNTER — Ambulatory Visit
Admission: RE | Admit: 2019-12-06 | Discharge: 2019-12-06 | Disposition: A | Discharge status: Home / Self Care | Payer: BC Managed Care – PPO | Source: Ambulatory Visit

## 2019-12-06 ENCOUNTER — Other Ambulatory Visit: Payer: Self-pay

## 2019-12-06 DIAGNOSIS — Z1231 Encounter for screening mammogram for malignant neoplasm of breast: Secondary | ICD-10-CM

## 2019-12-14 ENCOUNTER — Other Ambulatory Visit: Payer: Self-pay

## 2019-12-14 ENCOUNTER — Ambulatory Visit
Admission: RE | Admit: 2019-12-14 | Discharge: 2019-12-14 | Disposition: A | Discharge status: Home / Self Care | Payer: BC Managed Care – PPO | Source: Ambulatory Visit | Attending: Obstetrics & Gynecology | Admitting: Obstetrics & Gynecology

## 2020-01-25 ENCOUNTER — Ambulatory Visit: Payer: BC Managed Care – PPO | Admitting: Cardiology

## 2020-01-31 NOTE — Progress Notes (Signed)
PCP:  Juluis Rainier, MD Primary Cardiologist: Norman Herrlich, MD Electrophysiologist: Will Jorja Loa, MD   Nicole Hays is a 54 y.o. female seen today for Will Jorja Loa, MD for routine electrophysiology followup.  Since last being seen in our clinic the patient reports doing very well. She still has occasional palpitations from time to time. These are usually short lasting. She has had some issues with arousal and her OBGYN suggested possible adjustment of BB therapy. She denies chest pain, dyspnea, PND, orthopnea, nausea, vomiting, dizziness, syncope, edema, weight gain, or early satiety.  Past Medical History:  Diagnosis Date   Anxiety    Asthma    rarely uses inhaler   Atrial fibrillation ([facility])    GERD (gastroesophageal reflux disease)    IBS (irritable bowel syndrome)    Migraines    last one years ago   Seasonal allergies    Shingles 1995   Past Surgical History:  Procedure Laterality Date   ATRIAL FIBRILLATION ABLATION N/A 05/11/2019   Procedure: ATRIAL FIBRILLATION ABLATION;  Surgeon: Regan Lemming, MD;  Location: [facility];  Service: Cardiovascular;  Laterality: N/A;   BREAST EXCISIONAL BIOPSY Left    @22  benign   DILATATION & CURETTAGE/HYSTEROSCOPY WITH MYOSURE N/A 08/11/2015   Procedure: DILATATION & CURETTAGE/HYSTEROSCOPY WITH MYOSURE;  Surgeon: 10/11/2015, MD;  Location: [facility];  Service: Gynecology;  Laterality: N/A;   FOOT SURGERY Bilateral    spurs   NOVASURE ABLATION N/A 08/11/2015   Procedure: NOVASURE ABLATION;  Surgeon: 10/11/2015, MD;  Location: [facility];  Service: Gynecology;  Laterality: N/A;   WISDOM TOOTH EXTRACTION      Current Outpatient Medications  Medication Sig Dispense Refill   acetaminophen (TYLENOL) 500 MG tablet Take 500-1,000 mg by mouth every 6 (six) hours as needed (for pain.).      albuterol (PROVENTIL HFA;VENTOLIN HFA) 108 (90 Base) MCG/ACT inhaler Inhale into the lungs every 6 (six)  hours as needed for wheezing or shortness of breath.     diphenoxylate-atropine (LOMOTIL) 2.5-0.025 MG per tablet Take 1 tablet by mouth 4 (four) times daily as needed for diarrhea or loose stools (IBS).      loratadine (CLARITIN) 10 MG tablet Take 10 mg by mouth daily.     Magnesium 250 MG TABS Take 250 mg by mouth every evening.     metoprolol tartrate (LOPRESSOR) 25 MG tablet TAKE 1 TABLET BY MOUTH TWICE A DAY 180 tablet 2   montelukast (SINGULAIR) 10 MG tablet Take 10 mg by mouth at bedtime.      Multiple Vitamins-Minerals (EMERGEN-C IMMUNE PLUS PO) Take 1 tablet by mouth daily as needed (To prevent cold.). Reported on 10/23/2015     neomycin-bacitracin-polymyxin (NEOSPORIN) ointment Apply 1 application topically 2 (two) times daily as needed for wound care.     vitamin C (ASCORBIC ACID) 500 MG tablet Take 500 mg by mouth 2 (two) times daily.      Vitamin D3 (VITAMIN D) 25 MCG tablet Take 1,000 Units by mouth every evening.     No current facility-administered medications for this visit.    Allergies  Allergen Reactions   Ciprofloxacin Nausea And Vomiting   Penicillins Other (See Comments)    Did it involve swelling of the face/tongue/throat, SOB, or low BP? Unknown Did it involve sudden or severe rash/hives, skin peeling, or any reaction on the inside of your mouth or nose? Unknown Did you need to seek medical attention at a hospital or  doctor's office? Unknown When did it last happen?childhood reaction. If all above answers are NO, may proceed with cephalosporin use. Unknown childhood reaction.    Social History   Socioeconomic History   Marital status: Married    Spouse name: Not on file   Number of children: Not on file   Years of education: Not on file   Highest education level: Not on file  Occupational History   Not on file  Tobacco Use   Smoking status: Never Smoker   Smokeless tobacco: Never Used  Vaping Use   Vaping Use: Never used  Substance  and Sexual Activity   Alcohol use: Yes    Alcohol/week: 2.0 standard drinks    Types: 2 Glasses of wine per week    Comment: occ   Drug use: No   Sexual activity: Yes    Partners: Male    Birth control/protection: None    Comment: Husband has vasectomy  Other Topics Concern   Not on file  Social History Narrative   Not on file   Social Determinants of Health   Financial Resource Strain:    Difficulty of Paying Living Expenses: Not on file  Food Insecurity:    Worried About Programme researcher, broadcasting/film/video in the Last Year: Not on file   [employer] of Food in the Last Year: Not on file  Transportation Needs:    Lack of Transportation (Medical): Not on file   Lack of Transportation (Non-Medical): Not on file  Physical Activity:    Days of Exercise per Week: Not on file   Minutes of Exercise per Session: Not on file  Stress:    Feeling of Stress : Not on file  Social Connections:    Frequency of Communication with Friends and Family: Not on file   Frequency of Social Gatherings with Friends and Family: Not on file   Attends Religious Services: Not on file   Active Member of Clubs or Organizations: Not on file   Attends Banker Meetings: Not on file   Marital Status: Not on file  Intimate Partner Violence:    Fear of Current or Ex-Partner: Not on file   Emotionally Abused: Not on file   Physically Abused: Not on file   Sexually Abused: Not on file   Review of Systems: All other systems reviewed and are otherwise negative except as noted above.  Physical Exam: Vitals:   02/01/20 1230  BP: 118/80  Pulse: 65  SpO2: 99%  Weight: 180 lb (81.6 kg)  Height: 5\' 3"  (1.6 m)   GEN- The patient is well appearing, alert and oriented x 3 today.   HEENT: normocephalic, atraumatic; sclera clear, conjunctiva pink; hearing intact; oropharynx clear; neck supple, no JVP Lymph- no cervical lymphadenopathy Lungs- Clear to ausculation bilaterally, normal work of  breathing.  No wheezes, rales, rhonchi Heart- Regular rate and rhythm, no murmurs, rubs or gallops, PMI not laterally displaced GI- soft, non-tender, non-distended, bowel sounds present, no hepatosplenomegaly Extremities- no clubbing, cyanosis, or edema; DP/PT/radial pulses 2+ bilaterally MS- no significant deformity or atrophy Skin- warm and dry, no rash or lesion Psych- euthymic mood, full affect Neuro- strength and sensation are intact  EKG is not ordered.   Additional studies reviewed include: Previous EP office notes  Assessment and Plan:  1. Paroxysmal atrial fibrillation S/p ablation 05/11/2019 Will stop lopressor and change to toprol xl 50 mg daily as below She is off eliquis and multaq with no recurrence s/p ablation. May need  to consider OAC if she develops additional risk factors, currently CHA2DS2VASC of 1 (female)    2. Inhibited sexual arousal Discussed with Pharm-D and reviewed literature and this is not an uncommon side effect of beta blockers (approx 30-40% of females). Can consider decreasing dose or changing beta-blocker, but unlikely to provide much of a difference. She prefers to stay on metoprolol as it has worked so well for her.  We will switch to long acting to take at bedtime to see if this dose adjustment improves her symptoms. Could also consider decreasing to 25 mg daily, but I worry this would overall increase her palpitations.   Graciella Freer, PA-C  02/01/20 12:37 PM

## 2020-02-01 ENCOUNTER — Encounter: Payer: Self-pay | Admitting: Student

## 2020-02-01 ENCOUNTER — Other Ambulatory Visit: Payer: Self-pay

## 2020-02-01 ENCOUNTER — Ambulatory Visit (INDEPENDENT_AMBULATORY_CARE_PROVIDER_SITE_OTHER): Payer: BC Managed Care – PPO | Admitting: Student

## 2020-02-01 VITALS — BP 118/80 | HR 65 | Ht 63.0 in | Wt 180.0 lb

## 2020-02-01 DIAGNOSIS — F528 Other sexual dysfunction not due to a substance or known physiological condition: Secondary | ICD-10-CM | POA: Diagnosis not present

## 2020-02-01 DIAGNOSIS — I480 Paroxysmal atrial fibrillation: Secondary | ICD-10-CM | POA: Diagnosis not present

## 2020-02-01 MED ORDER — METOPROLOL SUCCINATE ER 50 MG PO TB24: 50.0000 mg | ORAL_TABLET | Freq: Every day | ORAL | 1 refills | Status: DC

## 2020-02-01 NOTE — Patient Instructions (Addendum)
Medication Instructions:  Your physician has recommended you make the following change in your medication:  -- STOP Lopressor (Metoprolol Tartrate)  -- START Metoprolol Succinate (Toprol XL) 50 mg - take 1 (50 mg) tablet by mouth daily at bedtime  *If you need a refill on your cardiac medications before your next appointment, please call your pharmacy*  Follow-Up: At [facility], you and your health needs are our priority.  As part of our continuing mission to provide you with exceptional heart care, we have created designated Provider Care Teams.  These Care Teams include your primary Cardiologist (physician) and Advanced Practice Providers (APPs -  Physician Assistants and Nurse Practitioners) who all work together to provide you with the care you need, when you need it.  We recommend signing up for the patient portal called "MyChart".  Sign up information is provided on this After Visit Summary.  MyChart is used to connect with patients for Virtual Visits (Telemedicine).  Patients are able to view lab/test results, encounter notes, upcoming appointments, etc.  Non-urgent messages can be sent to your provider as well.   To learn more about what you can do with MyChart, go to [email or web address].    Your next appointment:   Your physician recommends that you schedule a follow-up appointment in: 6 MONTHS with Dr. Elberta Fortis. -- Tuesday 07/22/20 at 3:45 pm  The format for your next appointment:   In Person with Loman Brooklyn, MD

## 2020-02-16 ENCOUNTER — Other Ambulatory Visit: Payer: Self-pay | Admitting: Cardiology

## 2020-06-05 ENCOUNTER — Ambulatory Visit: Payer: BC Managed Care – PPO

## 2020-07-21 ENCOUNTER — Other Ambulatory Visit: Payer: Self-pay | Admitting: Student

## 2020-07-22 ENCOUNTER — Ambulatory Visit: Payer: BC Managed Care – PPO | Admitting: Cardiology

## 2020-07-22 ENCOUNTER — Encounter: Payer: Self-pay | Admitting: Cardiology

## 2020-07-22 ENCOUNTER — Other Ambulatory Visit: Payer: Self-pay

## 2020-07-22 VITALS — BP 128/82 | HR 73 | Ht 63.0 in | Wt 178.2 lb

## 2020-07-22 DIAGNOSIS — G43909 Migraine, unspecified, not intractable, without status migrainosus: Secondary | ICD-10-CM | POA: Insufficient documentation

## 2020-07-22 DIAGNOSIS — F419 Anxiety disorder, unspecified: Secondary | ICD-10-CM | POA: Insufficient documentation

## 2020-07-22 DIAGNOSIS — I480 Paroxysmal atrial fibrillation: Secondary | ICD-10-CM | POA: Diagnosis not present

## 2020-07-22 DIAGNOSIS — J302 Other seasonal allergic rhinitis: Secondary | ICD-10-CM | POA: Insufficient documentation

## 2020-07-22 DIAGNOSIS — K589 Irritable bowel syndrome without diarrhea: Secondary | ICD-10-CM | POA: Insufficient documentation

## 2020-07-22 DIAGNOSIS — K219 Gastro-esophageal reflux disease without esophagitis: Secondary | ICD-10-CM | POA: Insufficient documentation

## 2020-07-22 DIAGNOSIS — J45909 Unspecified asthma, uncomplicated: Secondary | ICD-10-CM | POA: Insufficient documentation

## 2020-07-22 DIAGNOSIS — I4891 Unspecified atrial fibrillation: Secondary | ICD-10-CM | POA: Insufficient documentation

## 2020-07-22 NOTE — Progress Notes (Signed)
Electrophysiology Office Note   Date:  07/22/2020   ID:  Nicole, Hays Aug 26, 1965, MRN [id number]  PCP:  Juluis Rainier, MD  Cardiologist: Dulce Sellar Primary Electrophysiologist:  Will Jorja Loa, MD    Chief Complaint: Atrial fibrillation   History of Present Illness: Nicole Hays is a 55 y.o. female who is being seen today for the evaluation of atrial fibrillation at the request of Juluis Rainier, MD. Presenting today for electrophysiology evaluation.  She has a history significant for asthma and IBS.  She also has paroxysmal atrial fibrillation.  She was diagnosed in 2018.  She was previously on the Multaq.  She is status post AF ablation 05/11/2019.  Her Multaq and Eliquis have been stopped.  Today, denies symptoms of palpitations, chest pain, shortness of breath, orthopnea, PND, lower extremity edema, claudication, dizziness, presyncope, syncope, bleeding, or neurologic sequela. The patient is tolerating medications without difficulties.  Since being seen she has done well.  She has no chest pain or shortness of breath.  She is able do all of her daily activities without restriction.  She is noted no further episodes of atrial fibrillation.  Past Medical History:  Diagnosis Date  . Anxiety   . Asthma    rarely uses inhaler  . Atrial fibrillation (HCC)   . GERD (gastroesophageal reflux disease)   . IBS (irritable bowel syndrome)   . Migraines    last one years ago  . Seasonal allergies   . Shingles 1995   Past Surgical History:  Procedure Laterality Date  . ATRIAL FIBRILLATION ABLATION N/A 05/11/2019   Procedure: ATRIAL FIBRILLATION ABLATION;  Surgeon: Regan Lemming, MD;  [facility];  Service: Cardiovascular;  Laterality: N/A;  . BREAST EXCISIONAL BIOPSY Left    @22  benign  . DILATATION & CURETTAGE/HYSTEROSCOPY WITH MYOSURE N/A 08/11/2015   Procedure: DILATATION & CURETTAGE/HYSTEROSCOPY WITH MYOSURE;  Surgeon: 10/11/2015, MD;   Location: [facility];  Service: Gynecology;  Laterality: N/A;  . FOOT SURGERY Bilateral    spurs  . NOVASURE ABLATION N/A 08/11/2015   Procedure: NOVASURE ABLATION;  Surgeon: 10/11/2015, MD;  Location: [facility];  Service: Gynecology;  Laterality: N/A;  . WISDOM TOOTH EXTRACTION       Current Outpatient Medications  Medication Sig Dispense Refill  . acetaminophen (TYLENOL) 500 MG tablet Take 500-1,000 mg by mouth every 6 (six) hours as needed (for pain.).     Jerene Bears albuterol (PROVENTIL HFA;VENTOLIN HFA) 108 (90 Base) MCG/ACT inhaler Inhale into the lungs every 6 (six) hours as needed for wheezing or shortness of breath.    . diphenoxylate-atropine (LOMOTIL) 2.5-0.025 MG per tablet Take 1 tablet by mouth 4 (four) times daily as needed for diarrhea or loose stools (IBS).     07-20-1989 loratadine (CLARITIN) 10 MG tablet Take 10 mg by mouth daily.    . Magnesium 250 MG TABS Take 250 mg by mouth every evening.    . metoprolol succinate (TOPROL-XL) 50 MG 24 hr tablet TAKE 1 TABLET (50 MG TOTAL) BY MOUTH AT BEDTIME. TAKE WITH OR IMMEDIATELY FOLLOWING A MEAL. 90 tablet 1  . montelukast (SINGULAIR) 10 MG tablet Take 10 mg by mouth at bedtime.     . Multiple Vitamins-Minerals (EMERGEN-C IMMUNE PLUS PO) Take 1 tablet by mouth daily as needed (To prevent cold.). Reported on 10/23/2015    . neomycin-bacitracin-polymyxin (NEOSPORIN) ointment Apply 1 application topically 2 (two) times daily as needed for wound care.    . vitamin  C (ASCORBIC ACID) 500 MG tablet Take 500 mg by mouth 2 (two) times daily.     . Vitamin D3 (VITAMIN D) 25 MCG tablet Take 1,000 Units by mouth every evening.     No current facility-administered medications for this visit.    Allergies:   Ciprofloxacin and Penicillins   Social History:  The patient  reports that she has never smoked. She has never used smokeless tobacco. She reports current alcohol use of about 2.0 standard drinks of alcohol per week. She reports that she does not use drugs.    Family History:  The patient's family history includes Hyperlipidemia in her father; Hypertension in her mother; Osteoporosis in her maternal grandmother; Stroke in her maternal uncle and mother.   ROS:  Please see the history of present illness.   Otherwise, review of systems is positive for none.   All other systems are reviewed and negative.   PHYSICAL EXAM: VS:  BP 128/82   Pulse 73   Ht 5\' 3"  (1.6 m)   Wt 178 lb 3.2 oz (80.8 kg)   LMP 08/05/2015   SpO2 98%   BMI 31.57 kg/m  , BMI Body mass index is 31.57 kg/m. GEN: Well nourished, well developed, in no acute distress  HEENT: normal  Neck: no JVD, carotid bruits, or masses Cardiac: RRR; no murmurs, rubs, or gallops,no edema  Respiratory:  clear to auscultation bilaterally, normal work of breathing GI: soft, nontender, nondistended, + BS MS: no deformity or atrophy  Skin: warm and dry Neuro:  Strength and sensation are intact Psych: euthymic mood, full affect  EKG:  EKG is ordered today. Personal review of the ekg ordered shows sinus rhythm  Recent Labs: No results found for requested labs within last 8760 hours.    Lipid Panel  No results found for: CHOL, TRIG, HDL, CHOLHDL, VLDL, LDLCALC, LDLDIRECT   Wt Readings from Last 3 Encounters:  07/22/20 178 lb 3.2 oz (80.8 kg)  02/01/20 180 lb (81.6 kg)  08/13/19 189 lb 9.6 oz (86 kg)      Other studies Reviewed: Additional studies/ records that were reviewed today include: TTE 03/30/19  Review of the above records today demonstrates:  - Left ventricle: The cavity size was normal. Systolic function was   normal. The estimated ejection fraction was in the range of 55%   to 65%. Wall motion was normal; there were no regional wall   motion abnormalities. Left ventricular diastolic function   parameters were normal. - Aortic valve: Valve area (VTI): 2.35 cm^2. Valve area (Vmax):   2.47 cm^2. Valve area (Vmean): 2.41 cm^2.   ASSESSMENT AND PLAN:  1.  Paroxysmal  atrial fibrillation: Currently on metoprolol.  Status post ablation 05/11/2019.  CHA2DS2-VASc of 1.  She fortunately remains in sinus rhythm.  She has had no further episodes of atrial fibrillation.  She is comfortable on her metoprolol.  We will continue current management.    Current medicines are reviewed at length with the patient today.   The patient does not have concerns regarding her medicines.  The following changes were made today: None  Labs/ tests ordered today include:  Orders Placed This Encounter  Procedures  . EKG 12-Lead     Disposition:   FU with Will Camnitz 6 months  Signed, Will 07/09/2019, MD  07/22/2020 4:12 PM     [facility] [address] [place] [phone] (office) [phone] (fax)'

## 2020-07-22 NOTE — Patient Instructions (Signed)
Medication Instructions:  Your physician recommends that you continue on your current medications as directed. Please refer to the Current Medication list given to you today.  *If you need a refill on your cardiac medications before your next appointment, please call your pharmacy*   Lab Work: None ordered  Testing/Procedures: None ordered   Follow-Up: At [facility], you and your health needs are our priority.  As part of our continuing mission to provide you with exceptional heart care, we have created designated Provider Care Teams.  These Care Teams include your primary Cardiologist (physician) and Advanced Practice Providers (APPs -  Physician Assistants and Nurse Practitioners) who all work together to provide you with the care you need, when you need it.   Your next appointment:   6 month(s)  The format for your next appointment:   In Person  Provider:   You may see one of the following Advanced Practice Providers on your designated Care Team:    Amber Seiler, NP  Renee Ursuy, PA-C  Michael "Andy" Tillery, PA-C     Thank you for choosing [facility]!!   Daniela Hernan, RN [phone]     

## 2020-08-01 ENCOUNTER — Ambulatory Visit: Payer: Self-pay | Admitting: Cardiology

## 2020-08-07 ENCOUNTER — Ambulatory Visit: Payer: BC Managed Care – PPO | Admitting: Nurse Practitioner

## 2020-08-07 ENCOUNTER — Other Ambulatory Visit: Payer: Self-pay

## 2020-08-07 ENCOUNTER — Encounter: Payer: Self-pay | Admitting: Nurse Practitioner

## 2020-08-07 VITALS — BP 118/78 | Ht 63.0 in | Wt 179.0 lb

## 2020-08-07 DIAGNOSIS — Z01419 Encounter for gynecological examination (general) (routine) without abnormal findings: Secondary | ICD-10-CM | POA: Diagnosis not present

## 2020-08-07 DIAGNOSIS — N920 Excessive and frequent menstruation with regular cycle: Secondary | ICD-10-CM

## 2020-08-07 DIAGNOSIS — B372 Candidiasis of skin and nail: Secondary | ICD-10-CM

## 2020-08-07 MED ORDER — NYSTATIN-TRIAMCINOLONE 100000-0.1 UNIT/GM-% EX OINT: 1.0000 "application " | TOPICAL_OINTMENT | Freq: Two times a day (BID) | CUTANEOUS | 0 refills | Status: AC

## 2020-08-07 NOTE — Patient Instructions (Signed)

## 2020-08-07 NOTE — Progress Notes (Signed)
   Nicole Hays 12-Dec-1965 [id number]   History:  55 y.o. G2P2002 presents for annual exam. Endometrial blation in 2017, was amenorrheic until the last few months where she has had occasional blood with wiping. Had hot flashes a couple of years ago but these have stopped. Normal pap and mammogram history. Successful cardiac ablation for a fib January 2021.   Gynecologic History Patient's last menstrual period was 08/05/2015.   Contraception/Family planning: post menopausal status  Health Maintenance Last Pap: 08/24/2016. Results were: normal Last mammogram: 12/14/2019. Results were: normal Last colonoscopy: 12/2019 Last Dexa: N/A  Past medical history, past surgical history, family history and social history were all reviewed and documented in the EPIC chart.  ROS:  A ROS was performed and pertinent positives and negatives are included.  Exam:  Vitals:   08/07/20 1559  BP: 118/78  Weight: 179 lb (81.2 kg)  Height: 5\' 3"  (1.6 m)   Body mass index is 31.71 kg/m.  General appearance:  Normal Thyroid:  Symmetrical, normal in size, without palpable masses or nodularity. Respiratory  Auscultation:  Clear without wheezing or rhonchi Cardiovascular  Auscultation:  Regular rate, without rubs, murmurs or gallops  Edema/varicosities:  Not grossly evident Abdominal  Soft,nontender, without masses, guarding or rebound.  Liver/spleen:  No organomegaly noted  Hernia:  None appreciated  Skin  Inspection:  Grossly normal   Breasts: Examined lying and sitting.   Right: Without masses, retractions, discharge or axillary adenopathy.   Left: Without masses, retractions, discharge or axillary adenopathy. Gentitourinary   Inguinal/mons:  Normal without inguinal adenopathy  External genitalia:  Redness to perineum consistent with fungal infection  BUS/Urethra/Skene's glands:  Normal  Vagina:  Normal  Cervix:  Normal  Uterus:  Normal in size, shape and contour.  Midline and  mobile  Adnexa/parametria:     Rt: Without masses or tenderness.   Lt: Without masses or tenderness.  Anus and perineum: Normal  Digital rectal exam: Normal sphincter tone without palpated masses or tenderness  Assessment/Plan:  55 y.o. 42 for annual exam.   Well female exam with routine gynecological exam - Education provided on SBEs, importance of preventative screenings, current guidelines, high calcium diet, regular exercise, and multivitamin daily. Labs with PCP.   Spotting - Endometrial blation in 2017, was amenorrheic until the last few months where she has had occasional blood with wiping. Had hot flashes a couple of years ago but these have stopped. Will check to see if she is postmenopausal. If menopausal we will proceed with pelvic ultrasound, otherwise we will monitor.   Skin yeast infection - Plan: nystatin-triamcinolone ointment (MYCOLOG) twice daily to perineum x 7-10 days.   Screening for cervical cancer - Normal Pap history.  Will repeat at 5-year interval per guidelines.  Screening for breast cancer - Normal mammogram history.  Continue annual screenings.  Normal breast exam today.  Screening for colon cancer - 2021 colonoscopy. Will repeat at GI's recommended interval.   Return in 1 year for annual.     2022 DNP, 4:10 PM 08/07/2020

## 2020-08-08 ENCOUNTER — Other Ambulatory Visit: Payer: Self-pay | Admitting: Nurse Practitioner

## 2020-08-08 DIAGNOSIS — N950 Postmenopausal bleeding: Secondary | ICD-10-CM

## 2020-08-08 LAB — FOLLICLE STIMULATING HORMONE: FSH: 91.2 m[IU]/mL

## 2020-08-11 NOTE — Progress Notes (Signed)
Spoke with patient appointment scheduled for May 3.

## 2020-09-02 ENCOUNTER — Ambulatory Visit (INDEPENDENT_AMBULATORY_CARE_PROVIDER_SITE_OTHER): Payer: BC Managed Care – PPO | Admitting: Obstetrics & Gynecology

## 2020-09-02 ENCOUNTER — Ambulatory Visit: Payer: BC Managed Care – PPO

## 2020-09-02 ENCOUNTER — Encounter: Payer: Self-pay | Admitting: Obstetrics & Gynecology

## 2020-09-02 ENCOUNTER — Other Ambulatory Visit: Payer: Self-pay

## 2020-09-02 VITALS — BP 140/86

## 2020-09-02 DIAGNOSIS — N854 Malposition of uterus: Secondary | ICD-10-CM

## 2020-09-02 DIAGNOSIS — N950 Postmenopausal bleeding: Secondary | ICD-10-CM

## 2020-09-02 NOTE — Progress Notes (Signed)
    Nicole Hays 1965-06-14 [id number]        55 y.o.  [id number]   RP: PMB for Pelvic US  HPI: Endometrial blation in 2017, was amenorrheic until the last few months where she has had occasional blood with wiping.  No PMB x 2 months now.  No pelvic pain.  Had hot flashes a couple of years ago but these have stopped.  FSH 91.2 on 08/07/2020.  Colonoscopy:  Latent Crohn's disease.  Constipation/diarrhea cycles.  OB History  Gravida Para Term Preterm AB Living  2 2 2     2   SAB IAB Ectopic Multiple Live Births          2    # Outcome Date GA Lbr Len/2nd Weight Sex Delivery Anes PTL Lv  2 Term 04/1999 [redacted]w[redacted]d  5 lb 10 oz (2.551 kg) F Vag-Spont   LIV  1 Term 03/1996 [redacted]w[redacted]d  6 lb 12 oz (3.062 kg) M Vag-Spont   LIV    Past medical history,surgical history, problem list, medications, allergies, family history and social history were all reviewed and documented in the EPIC chart.   Directed ROS with pertinent positives and negatives documented in the history of present illness/assessment and plan.  Exam:  There were no vitals filed for this visit. General appearance:  Normal  Pelvic [redacted]w[redacted]d today: 55 T/V images.  A small anteverted uterus with no myometrial mass seen.  The uterus is measured at 4.47 x 3.6 x 2.41 cm.  The endometrial lining is thin, slightly irregular status post endometrial ablation, measured at 3.23 mm.  No mass or thickening or abnormal blood flow seen at the endometrium.  The left ovary is small and atrophic.  The right ovary has 2 small simple avascular cystic structures measured at 2 cm and 1.2 cm.  No other adnexal mass.  No free fluid in the posterior cul-de-sac.   Assessment/Plan:  55 y.o. G2P2002   1. PMB (postmenopausal bleeding) Probable perimenopausal bleeding about 2 months ago.  No recurrence since then.  Status post endometrial ablation in 2017.  FSH in menopausal range August 07, 2020.  Pelvic ultrasound findings reviewed thoroughly with patient.  Endometrial lining is  thin at 3.23 mm.  Right ovary with 2 small simple avascular cystic structures measured at 2 and 1.2 cm.  Patient reassured.  Will observe.  Postmenopausal bleeding precautions reviewed.  August 09, 2020 MD, 2:34 PM 09/02/2020

## 2020-11-06 ENCOUNTER — Other Ambulatory Visit (HOSPITAL_BASED_OUTPATIENT_CLINIC_OR_DEPARTMENT_OTHER): Payer: Self-pay | Admitting: Family Medicine

## 2020-11-06 DIAGNOSIS — Z1231 Encounter for screening mammogram for malignant neoplasm of breast: Secondary | ICD-10-CM

## 2020-12-01 NOTE — Progress Notes (Signed)
Cardiology Office Note:    Date:  12/02/2020   ID:  Nicole Hays, DOB 08/02/1965, MRN [id number]  PCP:  Juluis Rainier, MD  Cardiologist:  Norman Herrlich, MD    Referring MD: Juluis Rainier, MD    ASSESSMENT:    1. Paroxysmal atrial fibrillation (HCC)   2. Status post catheter ablation of atrial fibrillation   3. Pure hypercholesterolemia    PLAN:    In order of problems listed above:  She has had a very nice response to A. fib catheter ablation no recurrence off antiarrhythmic drug off anticoagulant.  I encouraged her to continue with weight loss and healthy lifestyle activity and avoid over-the-counter proarrhythmic drugs and to continue to self monitor with her devices. She will try low-dose intermittent statin generally well-tolerated she has baseline severe dyslipidemia.  If tolerated she can follow-up at her next PCP visit with a lipid profile   Next appointment: 1 year   Medication Adjustments/Labs and Tests Ordered: Current medicines are reviewed at length with the patient today.  Concerns regarding medicines are outlined above.  Orders Placed This Encounter  Procedures   EKG 12-Lead   Meds ordered this encounter  Medications   pravastatin (PRAVACHOL) 20 MG tablet    Sig: Take 1 tablet (20 mg total) by mouth 3 (three) times a week. Take on Monday, Wednesday and Friday    Dispense:  36 tablet    Refill:  3    Chief Complaint  Patient presents with   Atrial Fibrillation     History of Present Illness:    Nicole Hays is a 55 y.o. female with a hx of paroxysmal atrial fibrillation last seen 12/08/2019 doing well maintaining sinus rhythm with Multaq.  Subsequently she had EP catheter ablation 0 05/11/2019 and her antiarrhythmic drug and anticoagulant were subsequently discontinued. Compliance with diet, lifestyle and medications: Yes  She has done well since her atrial fibrillation ablation much more confident vigorous active is lost weight and has  had no evidence of recurrent atrial fibrillation and uses the advanced apple watch with a heart rhythm alarm she also screens her heart rhythm with a kardia mobile device.  At times with upper respiratory infection she gets palpitation not severe or sustained and is careful to avoid over-the-counter proarrhythmic drugs. She has severe dyslipidemia had been on a statin in the past was poorly tolerant her LDL cholesterol year ago was 171.  Her coronary calcium score was 0 in January 2021 I advised and she agrees to try low-dose pravastatin 20 mg 3 times a week.  With a calcium score of 0 I would not put her on a PCSK9 inhibitor.  Her EKG performed by EP 07/22/2020 showed sinus rhythm normal except for PR interval top normal 200 ms.  There was no atrial EKG abnormality. Past Medical History:  Diagnosis Date   Anxiety    Asthma    rarely uses inhaler   Atrial fibrillation (HCC)    GERD (gastroesophageal reflux disease)    IBS (irritable bowel syndrome)    Migraines    last one years ago   Seasonal allergies    Shingles 1995    Past Surgical History:  Procedure Laterality Date   ATRIAL FIBRILLATION ABLATION N/A 05/11/2019   Procedure: ATRIAL FIBRILLATION ABLATION;  Surgeon: Regan Lemming, MD;  Location: [facility];  Service: Cardiovascular;  Laterality: N/A;   BREAST EXCISIONAL BIOPSY Left    @22  benign   DILATATION & CURETTAGE/HYSTEROSCOPY WITH MYOSURE N/A  08/11/2015   Procedure: DILATATION & CURETTAGE/HYSTEROSCOPY WITH MYOSURE;  Surgeon: Jerene Bears, MD;  Location: [facility];  Service: Gynecology;  Laterality: N/A;   FOOT SURGERY Bilateral    spurs   NOVASURE ABLATION N/A 08/11/2015   Procedure: NOVASURE ABLATION;  Surgeon: Jerene Bears, MD;  Location: [facility];  Service: Gynecology;  Laterality: N/A;   WISDOM TOOTH EXTRACTION      Current Medications: Current Meds  Medication Sig   acetaminophen (TYLENOL) 500 MG tablet Take 500-1,000 mg by mouth every 6 (six) hours as  needed (for pain.).    albuterol (PROVENTIL HFA;VENTOLIN HFA) 108 (90 Base) MCG/ACT inhaler Inhale into the lungs every 6 (six) hours as needed for wheezing or shortness of breath.   calcium carbonate (TUMS EX) 750 MG chewable tablet Chew 1 tablet by mouth daily as needed for heartburn.   diphenoxylate-atropine (LOMOTIL) 2.5-0.025 MG per tablet Take 1 tablet by mouth 4 (four) times daily as needed for diarrhea or loose stools (IBS).    loratadine (CLARITIN) 10 MG tablet Take 10 mg by mouth daily.   Magnesium 250 MG TABS Take 250 mg by mouth every evening.   metoprolol succinate (TOPROL-XL) 50 MG 24 hr tablet TAKE 1 TABLET (50 MG TOTAL) BY MOUTH AT BEDTIME. TAKE WITH OR IMMEDIATELY FOLLOWING A MEAL.   montelukast (SINGULAIR) 10 MG tablet Take 10 mg by mouth at bedtime.    Multiple Vitamins-Minerals (EMERGEN-C IMMUNE PLUS PO) Take 1 tablet by mouth daily as needed (To prevent cold.). Reported on 10/23/2015   neomycin-bacitracin-polymyxin (NEOSPORIN) ointment Apply 1 application topically 2 (two) times daily as needed for wound care.   nystatin-triamcinolone ointment (MYCOLOG) Apply 1 application topically 2 (two) times daily.   [START ON 12/03/2020] pravastatin (PRAVACHOL) 20 MG tablet Take 1 tablet (20 mg total) by mouth 3 (three) times a week. Take on Monday, Wednesday and Friday   vitamin C (ASCORBIC ACID) 500 MG tablet Take 500 mg by mouth 2 (two) times daily.    Vitamin D3 (VITAMIN D) 25 MCG tablet Take 1,000 Units by mouth every evening.     Allergies:   Ciprofloxacin and Penicillins   Social History   Socioeconomic History   Marital status: Married    Spouse name: Not on file   Number of children: Not on file   Years of education: Not on file   Highest education level: Not on file  Occupational History   Not on file  Tobacco Use   Smoking status: Never   Smokeless tobacco: Never  Vaping Use   Vaping Use: Never used  Substance and Sexual Activity   Alcohol use: Yes     Alcohol/week: 2.0 standard drinks    Types: 2 Glasses of wine per week    Comment: occ   Drug use: No   Sexual activity: Yes    Partners: Male    Comment: Husband has vasectomy  Other Topics Concern   Not on file  Social History Narrative   Not on file   Social Determinants of Health   Financial Resource Strain: Not on file  Food Insecurity: Not on file  Transportation Needs: Not on file  Physical Activity: Not on file  Stress: Not on file  Social Connections: Not on file     Family History: The patient's family history includes Hyperlipidemia in her father; Hypertension in her mother; Osteoporosis in her maternal grandmother; Stroke in her maternal uncle and mother. ROS:   Please see the history of present illness.  All other systems reviewed and are negative.  EKGs/Labs/Other Studies Reviewed:    The following studies were reviewed today:    Physical Exam:    VS:  BP 131/81 (BP Location: Left Arm, Patient Position: Sitting, Cuff Size: Normal)   Pulse 66   Ht 5\' 3"  (1.6 m)   Wt 178 lb 1.3 oz (80.8 kg)   LMP 08/05/2015   SpO2 97%   BMI 31.55 kg/m     Wt Readings from Last 3 Encounters:  12/02/20 178 lb 1.3 oz (80.8 kg)  08/07/20 179 lb (81.2 kg)  07/22/20 178 lb 3.2 oz (80.8 kg)     GEN: She has no xanthoma xanthelasma well nourished, well developed in no acute distress HEENT: Normal NECK: No JVD; No carotid bruits LYMPHATICS: No lymphadenopathy CARDIAC: RRR, no murmurs, rubs, gallops RESPIRATORY:  Clear to auscultation without rales, wheezing or rhonchi  ABDOMEN: Soft, non-tender, non-distended MUSCULOSKELETAL:  No edema; No deformity  SKIN: Warm and dry NEUROLOGIC:  Alert and oriented x 3 PSYCHIATRIC:  Normal affect    Signed, 07/24/20, MD  12/02/2020 9:58 AM    [facility]

## 2020-12-02 ENCOUNTER — Encounter: Payer: Self-pay | Admitting: Cardiology

## 2020-12-02 ENCOUNTER — Other Ambulatory Visit: Payer: Self-pay

## 2020-12-02 ENCOUNTER — Ambulatory Visit: Payer: BC Managed Care – PPO | Admitting: Cardiology

## 2020-12-02 VITALS — BP 131/81 | HR 66 | Ht 63.0 in | Wt 178.1 lb

## 2020-12-02 DIAGNOSIS — Z9889 Other specified postprocedural states: Secondary | ICD-10-CM | POA: Diagnosis not present

## 2020-12-02 DIAGNOSIS — I480 Paroxysmal atrial fibrillation: Secondary | ICD-10-CM | POA: Diagnosis not present

## 2020-12-02 DIAGNOSIS — E7800 Pure hypercholesterolemia, unspecified: Secondary | ICD-10-CM | POA: Diagnosis not present

## 2020-12-02 MED ORDER — PRAVASTATIN SODIUM 20 MG PO TABS: 20.0000 mg | ORAL_TABLET | ORAL | 3 refills | Status: DC

## 2020-12-02 NOTE — Patient Instructions (Signed)
Medication Instructions:  Your physician has recommended you make the following change in your medication:  START PRAVASTATIN 20MG  THREE TIMES A WEEK ON Monday, Wednesday, AND FRIDAY  *If you need a refill on your cardiac medications before your next appointment, please call your pharmacy*   Lab Work: NONE If you have labs (blood work) drawn today and your tests are completely normal, you will receive your results only by: MyChart Message (if you have MyChart) OR A paper copy in the mail If you have any lab test that is abnormal or we need to change your treatment, we will call you to review the results.   Testing/Procedures: EKG   Follow-Up: At [facility], you and your health needs are our priority.  As part of our continuing mission to provide you with exceptional heart care, we have created designated Provider Care Teams.  These Care Teams include your primary Cardiologist (physician) and Advanced Practice Providers (APPs -  Physician Assistants and Nurse Practitioners) who all work together to provide you with the care you need, when you need it.  We recommend signing up for the patient portal called "MyChart".  Sign up information is provided on this After Visit Summary.  MyChart is used to connect with patients for Virtual Visits (Telemedicine).  Patients are able to view lab/test results, encounter notes, upcoming appointments, etc.  Non-urgent messages can be sent to your provider as well.   To learn more about what you can do with MyChart, go to [facility].    Your next appointment:   1 year(s)  The format for your next appointment:   In Person  Provider:   [email or web address], MD   Other Instructions

## 2020-12-16 ENCOUNTER — Ambulatory Visit (HOSPITAL_BASED_OUTPATIENT_CLINIC_OR_DEPARTMENT_OTHER)
Admission: RE | Admit: 2020-12-16 | Discharge: 2020-12-16 | Disposition: A | Discharge status: Home / Self Care | Payer: BC Managed Care – PPO | Source: Ambulatory Visit | Attending: Family Medicine | Admitting: Family Medicine

## 2020-12-16 ENCOUNTER — Other Ambulatory Visit: Payer: Self-pay

## 2020-12-16 ENCOUNTER — Encounter (HOSPITAL_BASED_OUTPATIENT_CLINIC_OR_DEPARTMENT_OTHER): Payer: Self-pay

## 2020-12-16 DIAGNOSIS — Z1231 Encounter for screening mammogram for malignant neoplasm of breast: Secondary | ICD-10-CM | POA: Insufficient documentation

## 2021-01-22 ENCOUNTER — Other Ambulatory Visit: Payer: Self-pay | Admitting: Cardiology

## 2021-01-25 ENCOUNTER — Other Ambulatory Visit: Payer: Self-pay

## 2021-04-20 ENCOUNTER — Other Ambulatory Visit: Payer: Self-pay | Admitting: Cardiology

## 2021-07-23 ENCOUNTER — Other Ambulatory Visit: Payer: Self-pay | Admitting: Cardiology

## 2021-08-12 NOTE — Progress Notes (Signed)
? ?  Nicole Hays April 02, 1966 [id number] ? ? ?History:  56 y.o. G2P2002 presents for annual exam. Endometrial blation in 2017. FSH 91 one year ago. Normal pap and mammogram history. HLD, a fib managed by cardiology.  ? ?Gynecologic History ?Patient's last menstrual period was 08/05/2015. ?  ?Contraception/Family planning: post menopausal status ?Sexually active: Yes ? ?Health Maintenance ?Last Pap: 08/24/2016. Results were: Normal ?Last mammogram: 12/16/2020 Results were: Normal ?Last colonoscopy: 12/2019. Results were: Latent crohn's ?Last Dexa: Never ? ?Past medical history, past surgical history, family history and social history were all reviewed and documented in the EPIC chart. 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 22 and 72 yo children, both live local. Has hobby farm with goats, chickens, gennies.  ? ?ROS:  A ROS was performed and pertinent positives and negatives are included. ? ?Exam: ? ?Vitals:  ? 08/13/21 1122  ?BP: 118/74  ?Weight: 174 lb (78.9 kg)  ?Height: 5\' 2"  (1.575 m)  ? ? ?Body mass index is 31.83 kg/m?. ? ?General appearance:  Normal ?Thyroid:  Symmetrical, normal in size, without palpable masses or nodularity. ?Respiratory ? Auscultation:  Clear without wheezing or rhonchi ?Cardiovascular ? Auscultation:  Regular rate, without rubs, murmurs or gallops ? Edema/varicosities:  Not grossly evident ?Abdominal ? Soft,nontender, without masses, guarding or rebound. ? Liver/spleen:  No organomegaly noted ? Hernia:  None appreciated ? Skin ? Inspection:  Grossly normal ?  ?Breasts: Examined lying and sitting.  ? Right: Without masses, retractions, discharge or axillary adenopathy. ? ? Left: Without masses, retractions, discharge or axillary adenopathy. ?Genitourinary  ? Inguinal/mons:  Normal without inguinal adenopathy ? External genitalia:  Normal appearing vulva with no masses, tenderness, or lesions ? BUS/Urethra/Skene's glands:  Normal ? Vagina:  Normal appearing with normal color and  discharge, no lesions ? Cervix:  Normal appearing without discharge or lesions. Polyp @ 5 o'clock about 17mm ? Uterus:  Normal in size, shape and contour.  Midline and mobile, nontender ? Adnexa/parametria:   ?  Rt: Normal in size, without masses or tenderness. ?  Lt: Normal in size, without masses or tenderness. ? Anus and perineum: Normal ? Digital rectal exam: Normal sphincter tone without palpated masses or tenderness ? ?Patient informed chaperone available to be present for breast and pelvic exam. Patient has requested no chaperone to be present. Patient has been advised what will be completed during breast and pelvic exam.  ? ?Assessment/Plan:  56 y.o. 59 for annual exam.  ? ?Well female exam with routine gynecological exam - Plan: Cytology - PAP( [place]). Education provided on SBEs, importance of preventative screenings, current guidelines, high calcium diet, regular exercise, and multivitamin daily.  Labs with PCP and cardiology.  ? ?Postmenopausal - Plan: DG Bone Density. No HRT.  ? ?Family history of osteoporosis - Plan: DG Bone Density. MGM and 2 maternal aunts. Will do DXA now. Continue Vitamin D supplement, high calcium diet, and increase exercise.  ? ?Cervical polyp - Plan: Surgical pathology. Polyp @ 5 o'clock position, removed with ring forceps, silver nitrate applied. She does report light spotting after intercourse sometimes.  ? ?Screening for cervical cancer - Normal Pap history. Pap with HR HPV today.  ? ?Screening for breast cancer - Normal mammogram history.  Continue annual screenings.  Normal breast exam today. ? ?Screening for colon cancer - 2021 colonoscopy. Will repeat at 5-year interval per GI's recommendation. ? ?Return in 1 year for annual.  ? ? ? ?2022 DNP, 11:53 AM 08/13/2021 ? ?

## 2021-08-13 ENCOUNTER — Other Ambulatory Visit (HOSPITAL_COMMUNITY)
Admission: RE | Admit: 2021-08-13 | Discharge: 2021-08-13 | Disposition: A | Discharge status: Home / Self Care | Payer: BC Managed Care – PPO | Source: Ambulatory Visit | Attending: Nurse Practitioner | Admitting: Nurse Practitioner

## 2021-08-13 ENCOUNTER — Ambulatory Visit (INDEPENDENT_AMBULATORY_CARE_PROVIDER_SITE_OTHER): Payer: BC Managed Care – PPO | Admitting: Nurse Practitioner

## 2021-08-13 ENCOUNTER — Encounter: Payer: Self-pay | Admitting: Nurse Practitioner

## 2021-08-13 VITALS — BP 118/74 | Ht 62.0 in | Wt 174.0 lb

## 2021-08-13 DIAGNOSIS — Z780 Asymptomatic menopausal state: Secondary | ICD-10-CM

## 2021-08-13 DIAGNOSIS — Z8262 Family history of osteoporosis: Secondary | ICD-10-CM

## 2021-08-13 DIAGNOSIS — N841 Polyp of cervix uteri: Secondary | ICD-10-CM | POA: Insufficient documentation

## 2021-08-13 DIAGNOSIS — Z01419 Encounter for gynecological examination (general) (routine) without abnormal findings: Secondary | ICD-10-CM | POA: Diagnosis not present

## 2021-08-14 ENCOUNTER — Ambulatory Visit: Payer: BC Managed Care – PPO | Admitting: Student

## 2021-08-14 LAB — CYTOLOGY - PAP
Comment: NEGATIVE
Diagnosis: NEGATIVE
High risk HPV: NEGATIVE

## 2021-08-14 LAB — SURGICAL PATHOLOGY

## 2021-08-25 NOTE — Progress Notes (Addendum)
? ?Cardiology Office Note ?Date:  08/31/2021  ?Patient ID:  Nicole Hays, DOB 1965-05-27, MRN [id number] ?PCP:  Soundra Pilon, FNP  ?Cardiologist:  Dr. Dulce Sellar ?Electrophysiologist: Dr. Elberta Fortis ? ?  ?Chief Complaint:  annual visit ? ?History of Present Illness: ?Nicole Hays is a 56 y.o. female with history of asthma, IBS, AFib, HLD ? ?She comes today to be seen for Dr. Elberta Fortis, last seen by him March 2022, she was post AF ablation Jan 2021, now off [facility] and AAD, she was feeling well.  No changes were made. ? ?She saw Dr. Dulce Sellar Aug 2022, again, doing well, no symptoms of her AFib, ancouraged healthy lifestyle, started on a statin ? ?TODAY ?She feels well. ?When she had Afib she was very aware of it, felt the palpitations, fatigued especially. ?She does not think she has had any AFib. ?NO CP, SOB, DOE, no syncope or sear syncope. ? ?She is very busy, but not formally exercising ?They have chickens, cares for the house, is still working, no difficulties with ADLs ? ?She inquires about the metoprolol, since being on it feels more sluggish has affected her libido, despite moving it to PM dosing ? ? ?AFib/AAD hx ?Diagnosed Oct 2018 ?PVI ablation 05/11/2019 ?Multaq started 2018 stopped post ablation April 2021 ? ?Past Medical History:  ?Diagnosis Date  ? Anxiety   ? Asthma   ? rarely uses inhaler  ? Atrial fibrillation (HCC)   ? GERD (gastroesophageal reflux disease)   ? IBS (irritable bowel syndrome)   ? Migraines   ? last one years ago  ? Seasonal allergies   ? Shingles 1995  ? ? ?Past Surgical History:  ?Procedure Laterality Date  ? ATRIAL FIBRILLATION ABLATION N/A 05/11/2019  ? Procedure: ATRIAL FIBRILLATION ABLATION;  Surgeon: Regan Lemming, MD;  [facility];  Service: Cardiovascular;  Laterality: N/A;  ? BREAST EXCISIONAL BIOPSY Left   ? @22  benign  ? DILATATION & CURETTAGE/HYSTEROSCOPY WITH MYOSURE N/A 08/11/2015  ? Procedure: DILATATION & CURETTAGE/HYSTEROSCOPY WITH MYOSURE;  Surgeon:  10/11/2015, MD;  Location: [facility];  Service: Gynecology;  Laterality: N/A;  ? FOOT SURGERY Bilateral   ? spurs  ? NOVASURE ABLATION N/A 08/11/2015  ? Procedure: NOVASURE ABLATION;  Surgeon: 10/11/2015, MD;  Location: [facility];  Service: Gynecology;  Laterality: N/A;  ? WISDOM TOOTH EXTRACTION    ? ? ?Current Outpatient Medications  ?Medication Sig Dispense Refill  ? acetaminophen (TYLENOL) 500 MG tablet Take 500-1,000 mg by mouth every 6 (six) hours as needed (for pain.).     ? albuterol (PROVENTIL HFA;VENTOLIN HFA) 108 (90 Base) MCG/ACT inhaler Inhale into the lungs every 6 (six) hours as needed for wheezing or shortness of breath.    ? calcium carbonate (TUMS EX) 750 MG chewable tablet Chew 1 tablet by mouth daily as needed for heartburn.    ? diphenoxylate-atropine (LOMOTIL) 2.5-0.025 MG per tablet Take 1 tablet by mouth 4 (four) times daily as needed for diarrhea or loose stools (IBS).     ? loratadine (CLARITIN) 10 MG tablet Take 10 mg by mouth daily.    ? Magnesium 250 MG TABS Take 250 mg by mouth every evening.    ? metoprolol succinate (TOPROL-XL) 50 MG 24 hr tablet Take 1 tablet (50 mg total) by mouth daily. With or immediately following a meal. Please keep upcoming appt in April 2023 with Cardiologist before anymore refills. Thank you Final Attempt 90 tablet 0  ? montelukast (SINGULAIR)  10 MG tablet Take 10 mg by mouth at bedtime.     ? Multiple Vitamins-Minerals (EMERGEN-C IMMUNE PLUS PO) Take 1 tablet by mouth daily as needed (To prevent cold.). Reported on 10/23/2015    ? neomycin-bacitracin-polymyxin (NEOSPORIN) ointment Apply 1 application topically 2 (two) times daily as needed for wound care.    ? nystatin-triamcinolone ointment (MYCOLOG) Apply 1 application topically 2 (two) times daily. 30 g 0  ? vitamin C (ASCORBIC ACID) 500 MG tablet Take 500 mg by mouth 2 (two) times daily.     ? Vitamin D3 (VITAMIN D) 25 MCG tablet Take 1,000 Units by mouth every evening.    ? pravastatin (PRAVACHOL) 20 MG  tablet Take 1 tablet (20 mg total) by mouth 3 (three) times a week. Take on Monday, Wednesday and Friday 36 tablet 3  ? ?No current facility-administered medications for this visit.  ? ? ?Allergies:   Ciprofloxacin and Penicillins  ? ?Social History:  The patient  reports that she has never smoked. She has never used smokeless tobacco. She reports current alcohol use of about 2.0 standard drinks per week. She reports that she does not use drugs.  ? ?Family History:  The patient's family history includes Hyperlipidemia in her father; Hypertension in her mother; Osteoporosis in her maternal aunt, maternal aunt, and maternal grandmother; Stroke in her maternal uncle and mother. ? ?ROS:  Please see the history of present illness.    ?All other systems are reviewed and otherwise negative.  ? ?PHYSICAL EXAM:  ?VS:  BP (!) 138/52   Pulse 68   Ht 5\' 3"  (1.6 m)   Wt 176 lb (79.8 kg)   LMP 08/05/2015   SpO2 98%   BMI 31.18 kg/m?  BMI: Body mass index is 31.18 kg/m?. ?Well nourished, well developed, in no acute distress ?HEENT: normocephalic, atraumatic ?Neck: no JVD, carotid bruits or masses ?Cardiac:  RRR; no significant murmurs, no rubs, or gallops ?Lungs:  TA b/l, no wheezing, rhonchi or rales ?Abd: soft, nontender ?MS: no deformity or  atrophy ?Ext: no edema ?Skin: warm and dry, no rash ?Neuro:  No gross deficits appreciated ?Psych: euthymic mood, full affect ? ? ?EKG:  not done today ? ?05/10/21; EPS/ablation ?CONCLUSIONS: ?1. Sinus rhythm upon presentation.   ?2. Successful electrical isolation and anatomical encircling of all four pulmonary veins with radiofrequency current. ?3. No inducible arrhythmias following ablation both on and off of Isuprel ?4. No early apparent complications. ? ?05/08/2019: Cardiac CT ?IMPRESSION: ?1. There is normal pulmonary vein drainage into the left atrium. ?2. There is no thrombus in the left atrial appendage. ?3. The esophagus runs in close proximity to the left pulmonary ?veins. ?4.  No PFO/ASD. ?5. Normal coronary origin. Right dominance. ?6. CAC score of 0. No CAD in the proximal segments. ?  ? ?03/29/2017: TTE ?Study Conclusions  ?- Left ventricle: The cavity size was normal. Systolic function was  ?  normal. The estimated ejection fraction was in the range of 55%  ?  to 65%. Wall motion was normal; there were no regional wall  ?  motion abnormalities. Left ventricular diastolic function  ?  parameters were normal.  ?- Aortic valve: Valve area (VTI): 2.35 cm^2. Valve area (Vmax):  ?  2.47 cm^2. Valve area (Vmean): 2.41 cm^2.  ? ?Impressions:  ?- Normal LVEF.  ?  Normal Diastolic function.  ?  Trace MR.  ? ?Recent Labs: ?No results found for requested labs within last 8760 hours.  ?No results found  for requested labs within last 8760 hours.  ? ?CrCl cannot be calculated (Patient's most recent lab result is older than the maximum 21 days allowed.).  ? ?Wt Readings from Last 3 Encounters:  ?08/31/21 176 lb (79.8 kg)  ?08/13/21 174 lb (78.9 kg)  ?12/02/20 178 lb 1.3 oz (80.8 kg)  ?  ? ?Other studies reviewed: ?Additional studies/records reviewed today include: summarized above ? ?ASSESSMENT AND PLAN: ? ?Paroxysmal AFib ?CHA2DS2Vasc is one for gender, off a/c post ablation ?No new medical conditions ?No symptoms of AFib ? ?We discussed BP on metoprolol not ideal and consideration of stopping it 2/2 side effects. ?She does not carry HTN as a known diagnosis. ?Encouraged exercise, weight loss, salt reduction and monitoring her BP at home with further discussion when she sees  Dr. Dulce SellarMunley at her upcoming visit ?Would not object to weaning off it from our perspective ? ?Disposition: F/u with EP annually, sooner if needed ? ?Current medicines are reviewed at length with the patient today.  The patient did not have any concerns regarding medicines. ? ?Signed, ?Francis Dowseenee Kalup Jaquith, PA-C ?08/31/2021 9:12 AM    ? ?[facility] ?[address] ?[address] ?[place] [id number] ?[phone] (office)   ?[phone] (fax) ? ? ?

## 2021-08-31 ENCOUNTER — Ambulatory Visit: Payer: BC Managed Care – PPO | Admitting: Physician Assistant

## 2021-08-31 ENCOUNTER — Encounter: Payer: Self-pay | Admitting: Physician Assistant

## 2021-08-31 VITALS — BP 138/52 | HR 68 | Ht 63.0 in | Wt 176.0 lb

## 2021-08-31 DIAGNOSIS — I480 Paroxysmal atrial fibrillation: Secondary | ICD-10-CM

## 2021-08-31 NOTE — Patient Instructions (Signed)
Medication Instructions:   Your physician recommends that you continue on your current medications as directed. Please refer to the Current Medication list given to you today.  *If you need a refill on your cardiac medications before your next appointment, please call your pharmacy*   Lab Work:  NONE ORDERED  TODAY   If you have labs (blood work) drawn today and your tests are completely normal, you will receive your results only by: MyChart Message (if you have MyChart) OR A paper copy in the mail If you have any lab test that is abnormal or we need to change your treatment, we will call you to review the results.   Testing/Procedures: NONE ORDERED  TODAY    Follow-Up: At [facility], you and your health needs are our priority.  As part of our continuing mission to provide you with exceptional heart care, we have created designated Provider Care Teams.  These Care Teams include your primary Cardiologist (physician) and Advanced Practice Providers (APPs -  Physician Assistants and Nurse Practitioners) who all work together to provide you with the care you need, when you need it.  We recommend signing up for the patient portal called "MyChart".  Sign up information is provided on this After Visit Summary.  MyChart is used to connect with patients for Virtual Visits (Telemedicine).  Patients are able to view lab/test results, encounter notes, upcoming appointments, etc.  Non-urgent messages can be sent to your provider as well.   To learn more about what you can do with MyChart, go to [email or web address].    Your next appointment:   1 year(s)  The format for your next appointment:   In Person  Provider:   You may see Will Martin Camnitz, MD or one of the following Advanced Practice Providers on your designated Care Team:   Renee Ursuy, PA-C Michael "Andy" Tillery, PA-C{    Other Instructions   Important Information About Sugar       

## 2021-10-20 ENCOUNTER — Other Ambulatory Visit: Payer: Self-pay | Admitting: Nurse Practitioner

## 2021-10-20 DIAGNOSIS — Z1231 Encounter for screening mammogram for malignant neoplasm of breast: Secondary | ICD-10-CM

## 2021-10-22 ENCOUNTER — Other Ambulatory Visit: Payer: Self-pay | Admitting: Cardiology

## 2021-11-09 ENCOUNTER — Other Ambulatory Visit: Payer: Self-pay | Admitting: Cardiology

## 2021-12-01 ENCOUNTER — Encounter: Payer: Self-pay | Admitting: Cardiology

## 2021-12-01 DIAGNOSIS — E7800 Pure hypercholesterolemia, unspecified: Secondary | ICD-10-CM

## 2021-12-04 LAB — COMPREHENSIVE METABOLIC PANEL
ALT: 12 IU/L (ref 0–32)
AST: 18 IU/L (ref 0–40)
Albumin/Globulin Ratio: 1.7 (ref 1.2–2.2)
Albumin: 4.1 g/dL (ref 3.8–4.9)
Alkaline Phosphatase: 58 IU/L (ref 44–121)
BUN/Creatinine Ratio: 17 (ref 9–23)
BUN: 10 mg/dL (ref 6–24)
Bilirubin Total: 0.3 mg/dL (ref 0.0–1.2)
CO2: 30 mmol/L — ABNORMAL HIGH (ref 20–29)
Calcium: 9.8 mg/dL (ref 8.7–10.2)
Chloride: 105 mmol/L (ref 96–106)
Creatinine, Ser: 0.58 mg/dL (ref 0.57–1.00)
Globulin, Total: 2.4 g/dL (ref 1.5–4.5)
Glucose: 91 mg/dL (ref 70–99)
Potassium: 3.7 mmol/L (ref 3.5–5.2)
Sodium: 145 mmol/L — ABNORMAL HIGH (ref 134–144)
Total Protein: 6.5 g/dL (ref 6.0–8.5)
eGFR: 107 mL/min/{1.73_m2} (ref >59)

## 2021-12-04 LAB — LIPID PANEL
Chol/HDL Ratio: 4.3 ratio (ref 0.0–4.4)
Cholesterol, Total: 254 mg/dL — ABNORMAL HIGH (ref 100–199)
HDL: 59 mg/dL (ref >39)
LDL Chol Calc (NIH): 182 mg/dL — ABNORMAL HIGH (ref 0–99)
Triglycerides: 76 mg/dL (ref 0–149)
VLDL Cholesterol Cal: 13 mg/dL (ref 5–40)

## 2021-12-06 ENCOUNTER — Other Ambulatory Visit: Payer: Self-pay | Admitting: Cardiology

## 2021-12-08 ENCOUNTER — Encounter: Payer: Self-pay | Admitting: Cardiology

## 2021-12-08 ENCOUNTER — Ambulatory Visit: Payer: BC Managed Care – PPO | Admitting: Cardiology

## 2021-12-08 VITALS — BP 112/72 | HR 73 | Ht 63.0 in | Wt 175.0 lb

## 2021-12-08 DIAGNOSIS — R1310 Dysphagia, unspecified: Secondary | ICD-10-CM | POA: Insufficient documentation

## 2021-12-08 DIAGNOSIS — J302 Other seasonal allergic rhinitis: Secondary | ICD-10-CM | POA: Insufficient documentation

## 2021-12-08 DIAGNOSIS — F528 Other sexual dysfunction not due to a substance or known physiological condition: Secondary | ICD-10-CM

## 2021-12-08 DIAGNOSIS — K5900 Constipation, unspecified: Secondary | ICD-10-CM | POA: Insufficient documentation

## 2021-12-08 DIAGNOSIS — I480 Paroxysmal atrial fibrillation: Secondary | ICD-10-CM | POA: Diagnosis not present

## 2021-12-08 DIAGNOSIS — G5710 Meralgia paresthetica, unspecified lower limb: Secondary | ICD-10-CM | POA: Insufficient documentation

## 2021-12-08 DIAGNOSIS — Z860100 Personal history of colon polyps, unspecified: Secondary | ICD-10-CM | POA: Insufficient documentation

## 2021-12-08 DIAGNOSIS — E7800 Pure hypercholesterolemia, unspecified: Secondary | ICD-10-CM | POA: Insufficient documentation

## 2021-12-08 MED ORDER — METOPROLOL SUCCINATE ER 25 MG PO TB24: 25.0000 mg | ORAL_TABLET | Freq: Every day | ORAL | 0 refills | Status: DC

## 2021-12-08 NOTE — Progress Notes (Signed)
Cardiology Office Note:    Date:  12/08/2021   ID:  Nicole Hays, DOB June 13, 1965, MRN [id number]  PCP:  Soundra Pilon, FNP  Cardiologist:  Norman Herrlich, MD    Referring MD: Soundra Pilon, FNP    ASSESSMENT:    1. Paroxysmal atrial fibrillation (HCC)   2. Inhibited sexual arousal    PLAN:    In order of problems listed above:  She has done exceptionally well no recurrence of atrial fibrillation self monitoring with a smart watch no longer anticoagulated I think she can withdraw the beta-blocker which may be causing her sexual dysfunction. Streamline her care she will follow-up with the EP clinic and see me back in the office as needed   Next appointment: I told her I think we can streamline her cardiology care and follow-up in EP clinic.  I will see her back in the office as needed   Medication Adjustments/Labs and Tests Ordered: Current medicines are reviewed at length with the patient today.  Concerns regarding medicines are outlined above.  Orders Placed This Encounter  Procedures   EKG 12-Lead   Meds ordered this encounter  Medications   metoprolol succinate (TOPROL XL) 25 MG 24 hr tablet    Sig: Take 1 tablet (25 mg total) by mouth daily.    Dispense:  50 tablet    Refill:  0    Chief complaint follow-up atrial fibrillation   History of Present Illness:    Nicole Hays is a 56 y.o. female with a hx of paroxysmal atrial fibrillation with EP catheter ablation last seen 12/02/2020. Compliance with diet, lifestyle and medications: Yes  She has had no recurrent episode of palpitation atrial fibrillation TIA and self monitors with a smart watch she has had no A-fib high or low rate detection She takes a beta-blocker is having sexual dysfunction and I told her we can withdraw her beta-blocker over several weeks and if she requires treatment we could choose a more hydrophilic beta-blocker like second trial or calcium channel blocker. Past Medical History:   Diagnosis Date   Anxiety    Asthma    rarely uses inhaler   Atrial fibrillation (HCC)    GERD (gastroesophageal reflux disease)    IBS (irritable bowel syndrome)    Migraines    last one years ago   Seasonal allergies    Shingles 1995    Past Surgical History:  Procedure Laterality Date   ATRIAL FIBRILLATION ABLATION N/A 05/11/2019   Procedure: ATRIAL FIBRILLATION ABLATION;  Surgeon: Regan Lemming, MD;  Location: [facility];  Service: Cardiovascular;  Laterality: N/A;   BREAST EXCISIONAL BIOPSY Left    @22  benign   DILATATION & CURETTAGE/HYSTEROSCOPY WITH MYOSURE N/A 08/11/2015   Procedure: DILATATION & CURETTAGE/HYSTEROSCOPY WITH MYOSURE;  Surgeon: 10/11/2015, MD;  Location: [facility];  Service: Gynecology;  Laterality: N/A;   FOOT SURGERY Bilateral    spurs   NOVASURE ABLATION N/A 08/11/2015   Procedure: NOVASURE ABLATION;  Surgeon: 10/11/2015, MD;  Location: [facility];  Service: Gynecology;  Laterality: N/A;   WISDOM TOOTH EXTRACTION      Current Medications: Current Meds  Medication Sig   acetaminophen (TYLENOL) 500 MG tablet Take 500-1,000 mg by mouth every 6 (six) hours as needed (for pain.).    albuterol (PROVENTIL HFA;VENTOLIN HFA) 108 (90 Base) MCG/ACT inhaler Inhale into the lungs every 6 (six) hours as needed for wheezing or shortness of breath.   calcium carbonate (  TUMS EX) 750 MG chewable tablet Chew 1 tablet by mouth daily as needed for heartburn.   diphenoxylate-atropine (LOMOTIL) 2.5-0.025 MG per tablet Take 1 tablet by mouth 4 (four) times daily as needed for diarrhea or loose stools (IBS).    loratadine (CLARITIN) 10 MG tablet Take 10 mg by mouth daily.   Magnesium 250 MG TABS Take 250 mg by mouth every evening.   metoprolol succinate (TOPROL XL) 25 MG 24 hr tablet Take 1 tablet (25 mg total) by mouth daily.   montelukast (SINGULAIR) 10 MG tablet Take 10 mg by mouth at bedtime.    Multiple Vitamins-Minerals (EMERGEN-C IMMUNE PLUS PO) Take 1 tablet  by mouth daily as needed (To prevent cold.). Reported on 10/23/2015   neomycin-bacitracin-polymyxin (NEOSPORIN) ointment Apply 1 application topically 2 (two) times daily as needed for wound care.   nystatin-triamcinolone ointment (MYCOLOG) Apply 1 application topically 2 (two) times daily.   pravastatin (PRAVACHOL) 20 MG tablet TAKE 1 TABLET (20 MG TOTAL) BY MOUTH 3 (THREE) TIMES A WEEK. TAKE ON MONDAY, WEDNESDAY AND FRIDAY   vitamin C (ASCORBIC ACID) 500 MG tablet Take 500 mg by mouth 2 (two) times daily.    Vitamin D3 (VITAMIN D) 25 MCG tablet Take 1,000 Units by mouth every evening.   [DISCONTINUED] metoprolol succinate (TOPROL-XL) 50 MG 24 hr tablet Take 1 tablet (50 mg total) by mouth daily. WITH A MEAL     Allergies:   Ciprofloxacin and Penicillins   Social History   Socioeconomic History   Marital status: Married    Spouse name: Not on file   Number of children: Not on file   Years of education: Not on file   Highest education level: Not on file  Occupational History   Not on file  Tobacco Use   Smoking status: Never   Smokeless tobacco: Never  Vaping Use   Vaping Use: Never used  Substance and Sexual Activity   Alcohol use: Yes    Alcohol/week: 2.0 standard drinks of alcohol    Types: 2 Glasses of wine per week    Comment: occ   Drug use: No   Sexual activity: Yes    Partners: Male    Comment: Husband has vasectomy  Other Topics Concern   Not on file  Social History Narrative   Not on file   Social Determinants of Health   Financial Resource Strain: Not on file  Food Insecurity: Not on file  Transportation Needs: Not on file  Physical Activity: Not on file  Stress: Not on file  Social Connections: Not on file     Family History: The patient's family history includes Hyperlipidemia in her father; Hypertension in her mother; Osteoporosis in her maternal aunt, maternal aunt, and maternal grandmother; Stroke in her maternal uncle and mother. ROS:   Please see  the history of present illness.    All other systems reviewed and are negative.  EKGs/Labs/Other Studies Reviewed:    The following studies were reviewed today:  EKG:  EKG ordered today and personally reviewed.  The ekg ordered today demonstrates sinus rhythm or R wave progression otherwise normal EKG  Recent Labs: 12/03/2021: ALT 12; BUN 10; Creatinine, Ser 0.58; Potassium 3.7; Sodium 145  Recent Lipid Panel    Component Value Date/Time   CHOL 254 (H) 12/03/2021 0913   TRIG 76 12/03/2021 0913   HDL 59 12/03/2021 0913   CHOLHDL 4.3 12/03/2021 0913   LDLCALC 182 (H) 12/03/2021 0913    Physical Exam:  VS:  BP 112/72   Pulse 73   Ht 5\' 3"  (1.6 m)   Wt 175 lb 0.6 oz (79.4 kg)   LMP 05/22/2015   SpO2 96%   BMI 31.01 kg/m     Wt Readings from Last 3 Encounters:  12/08/21 175 lb 0.6 oz (79.4 kg)  08/31/21 176 lb (79.8 kg)  08/13/21 174 lb (78.9 kg)     GEN:  Well nourished, well developed in no acute distress HEENT: Normal NECK: No JVD; No carotid bruits LYMPHATICS: No lymphadenopathy CARDIAC: RRR, no murmurs, rubs, gallops RESPIRATORY:  Clear to auscultation without rales, wheezing or rhonchi  ABDOMEN: Soft, non-tender, non-distended MUSCULOSKELETAL:  No edema; No deformity  SKIN: Warm and dry NEUROLOGIC:  Alert and oriented x 3 PSYCHIATRIC:  Normal affect    Signed, 08/15/21, MD  12/08/2021 5:37 PM    [facility]

## 2021-12-08 NOTE — Patient Instructions (Signed)
Medication Instructions:  Your physician has recommended you make the following change in your medication:   START: Toprol XL 25 mg daily for 2 weeks, then every other day for 2 weeks, then as needed  *If you need a refill on your cardiac medications before your next appointment, please call your pharmacy*   Lab Work: None If you have labs (blood work) drawn today and your tests are completely normal, you will receive your results only by: MyChart Message (if you have MyChart) OR A paper copy in the mail If you have any lab test that is abnormal or we need to change your treatment, we will call you to review the results.   Testing/Procedures: None   Follow-Up: At [facility], you and your health needs are our priority.  As part of our continuing mission to provide you with exceptional heart care, we have created designated Provider Care Teams.  These Care Teams include your primary Cardiologist (physician) and Advanced Practice Providers (APPs -  Physician Assistants and Nurse Practitioners) who all work together to provide you with the care you need, when you need it.  We recommend signing up for the patient portal called "MyChart".  Sign up information is provided on this After Visit Summary.  MyChart is used to connect with patients for Virtual Visits (Telemedicine).  Patients are able to view lab/test results, encounter notes, upcoming appointments, etc.  Non-urgent messages can be sent to your provider as well.   To learn more about what you can do with MyChart, go to [email or web address].    Your next appointment:   Follow up as needed  The format for your next appointment:   In Person  Provider:   Norman Herrlich, MD    Other Instructions None  Important Information About Sugar

## 2021-12-17 ENCOUNTER — Other Ambulatory Visit: Payer: Self-pay | Admitting: Nurse Practitioner

## 2021-12-17 DIAGNOSIS — Z1231 Encounter for screening mammogram for malignant neoplasm of breast: Secondary | ICD-10-CM

## 2021-12-18 DIAGNOSIS — Z1231 Encounter for screening mammogram for malignant neoplasm of breast: Secondary | ICD-10-CM

## 2021-12-21 ENCOUNTER — Encounter (HOSPITAL_COMMUNITY): Payer: BC Managed Care – PPO

## 2021-12-25 ENCOUNTER — Ambulatory Visit
Admission: RE | Admit: 2021-12-25 | Discharge: 2021-12-25 | Disposition: A | Discharge status: Home / Self Care | Payer: BC Managed Care – PPO | Source: Ambulatory Visit

## 2021-12-25 DIAGNOSIS — Z1231 Encounter for screening mammogram for malignant neoplasm of breast: Secondary | ICD-10-CM

## 2021-12-29 ENCOUNTER — Other Ambulatory Visit: Payer: Self-pay | Admitting: Cardiology

## 2021-12-29 NOTE — Telephone Encounter (Signed)
Rx refill sent to pharmacy. 

## 2022-01-02 IMAGING — MG MM DIGITAL SCREENING BILAT W/ TOMO AND CAD
8 series · 8 of 24 positions shown · non-contrast
Comparison: Previous exam(s).

CLINICAL DATA: Screening.

EXAM:
DIGITAL SCREENING BILATERAL MAMMOGRAM WITH TOMOSYNTHESIS AND CAD
TECHNIQUE: Bilateral screening digital craniocaudal and mediolateral oblique
mammograms were obtained. Bilateral screening digital breast
tomosynthesis was performed. The images were evaluated with
computer-aided detection.

[L CC synth-2D]
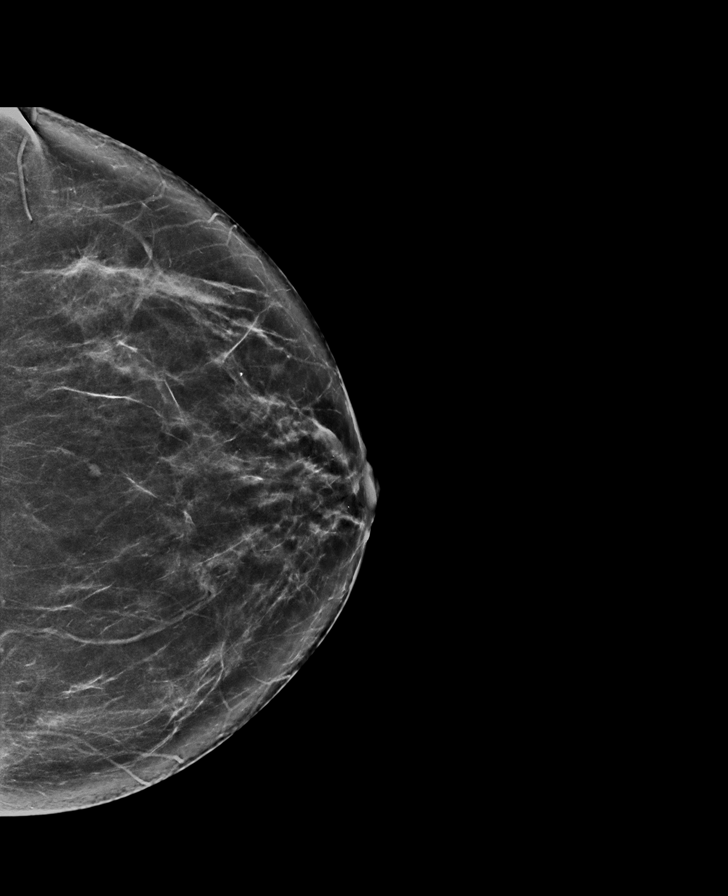

[R CC synth-2D]
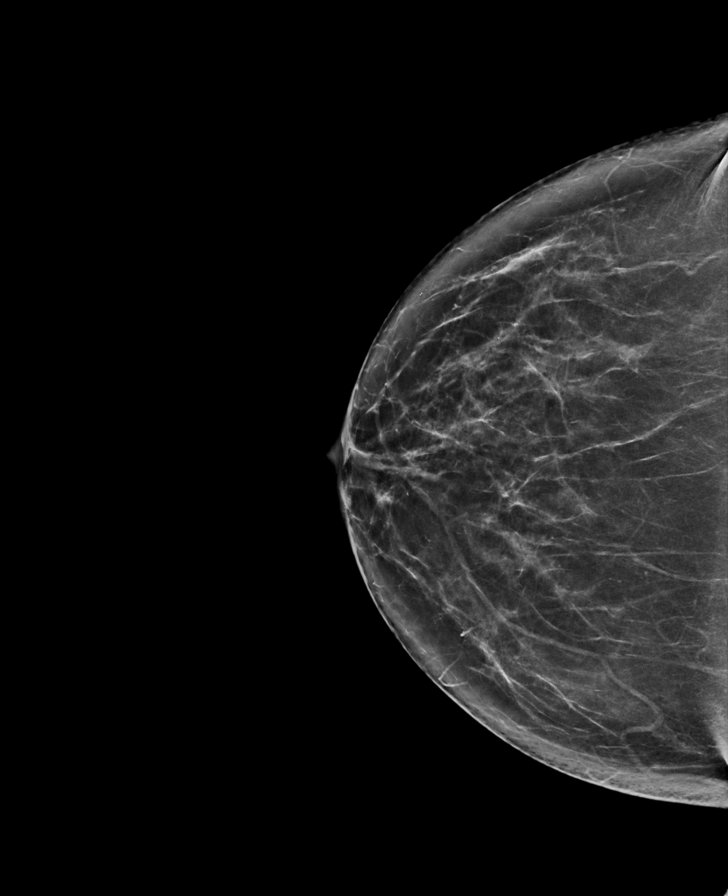

[R MLO synth-2D]
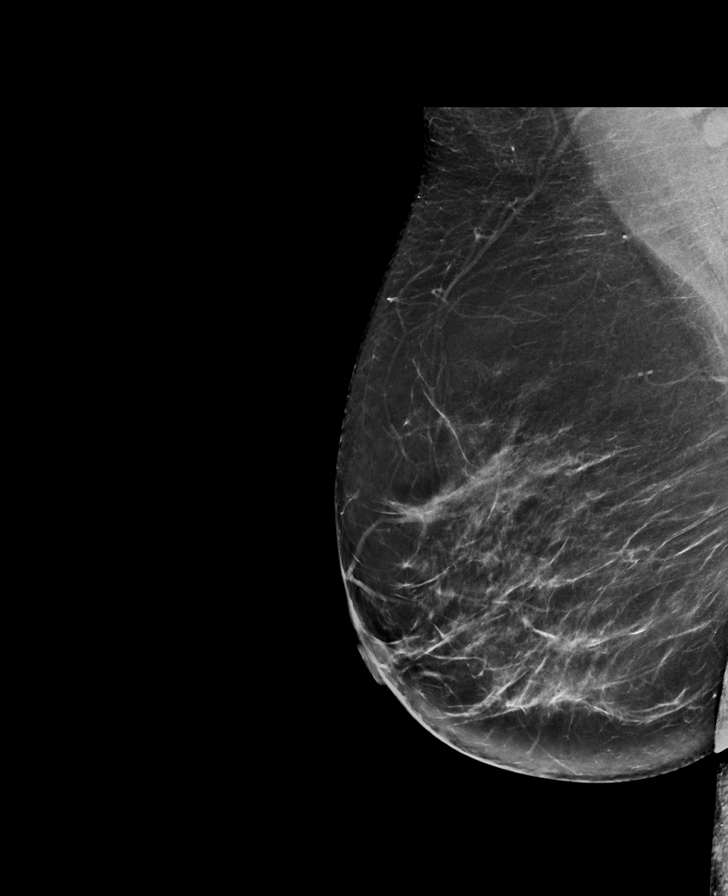

[L MLO synth-2D]
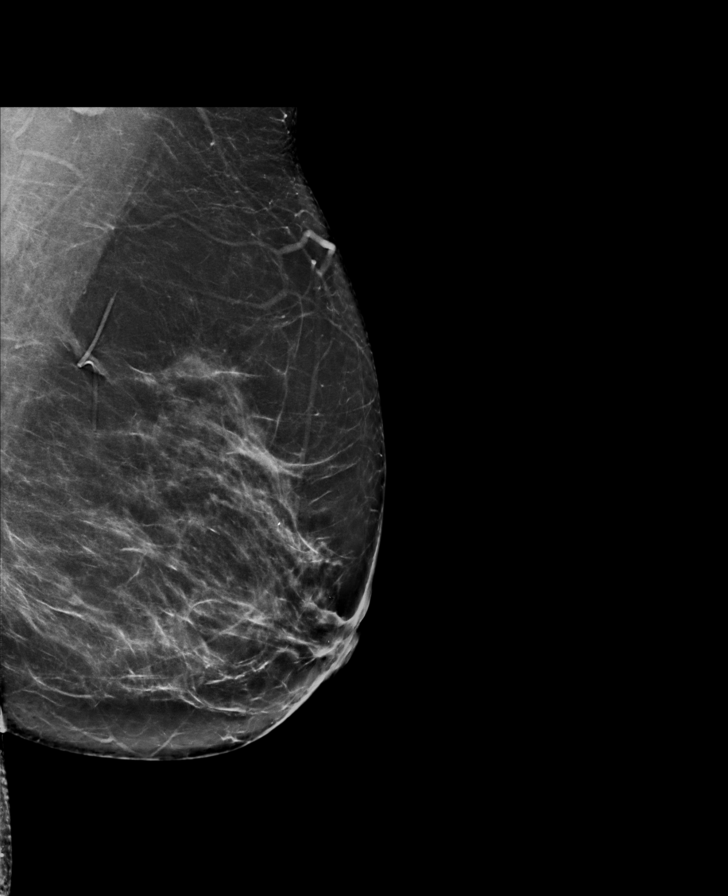

[R CC tomo · tomo slice 37/73.0]
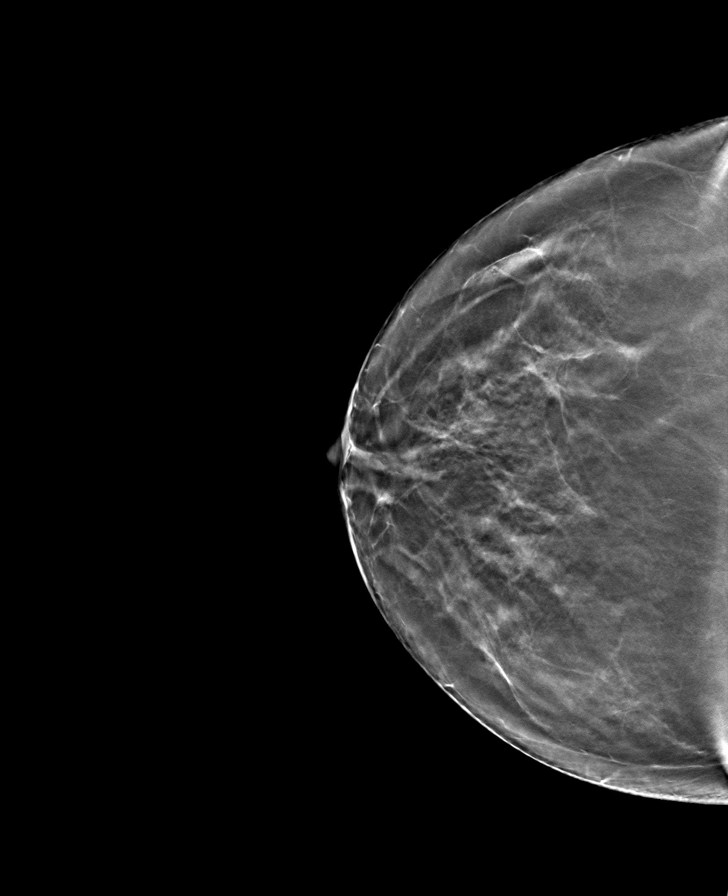

[L CC tomo · tomo slice 38/75.0]
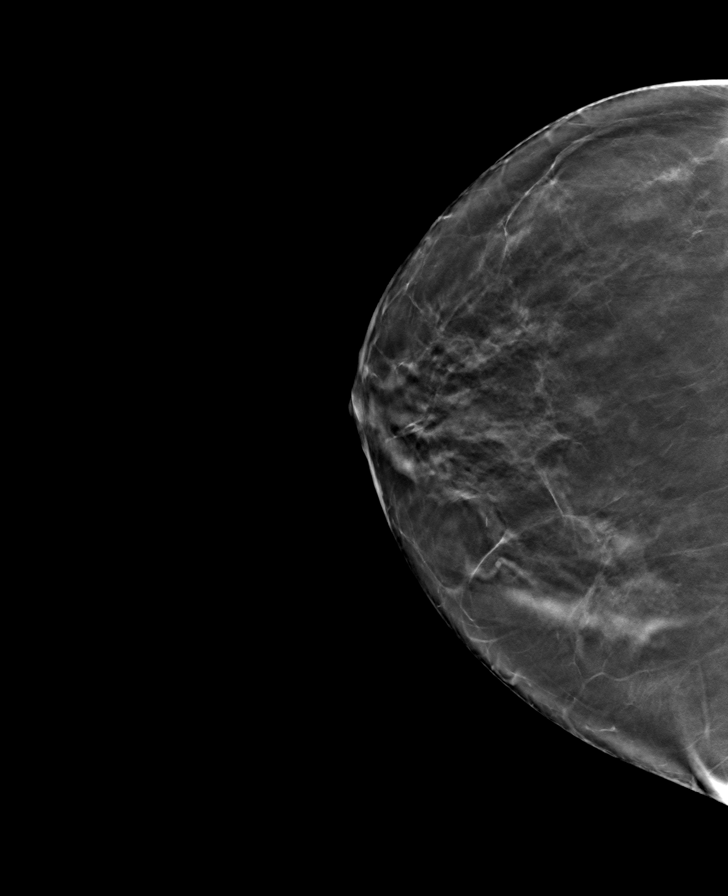

[R MLO tomo · tomo slice 42/83.0]
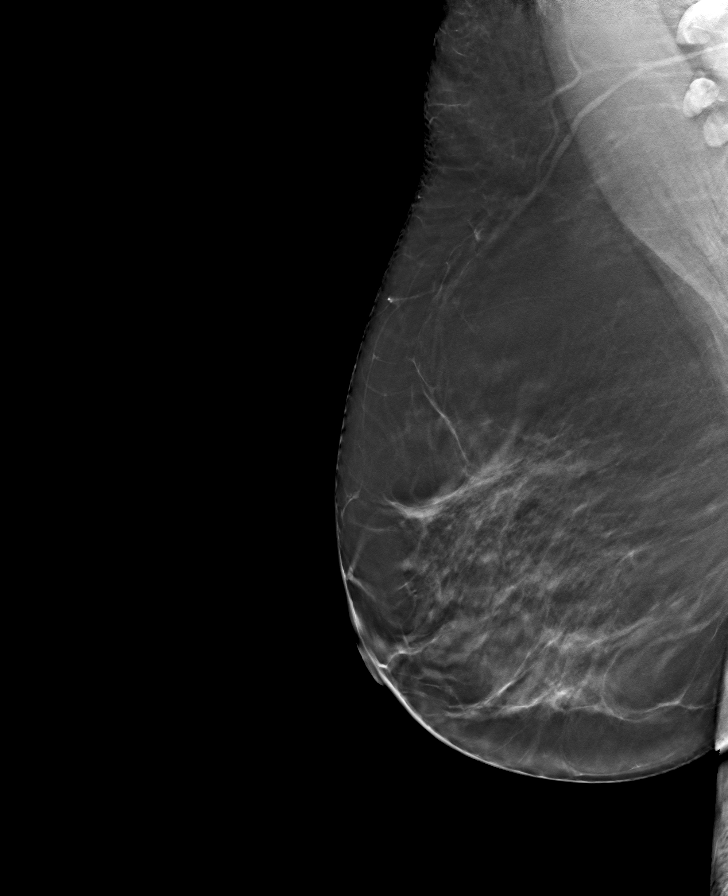

[L MLO tomo · tomo slice 42/83.0]
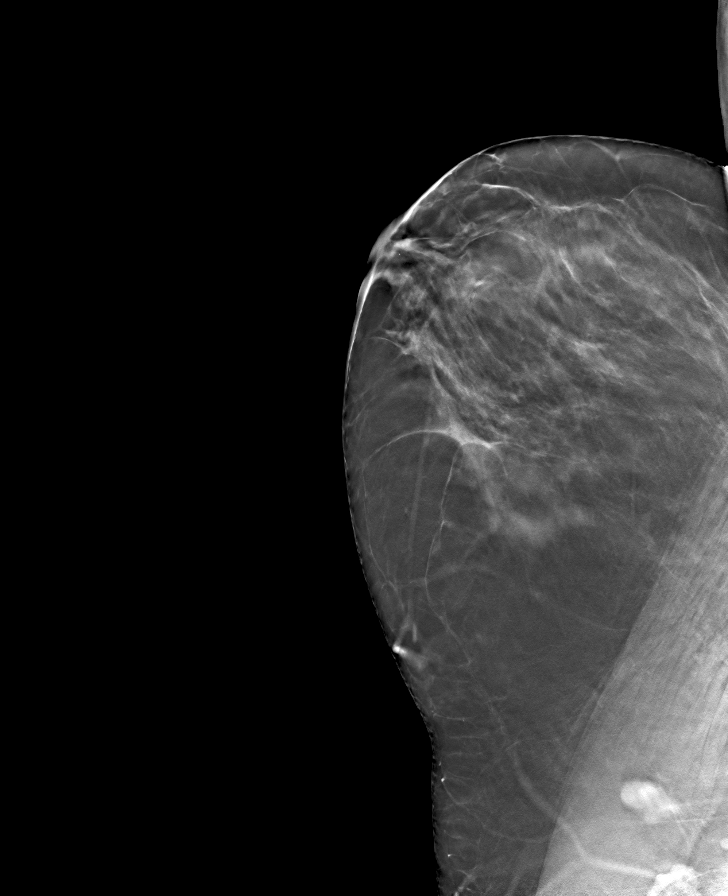

[8 of 24 positions shown; findings below may reference images not displayed]

ACR Breast Density Category b: There are scattered areas of
fibroglandular density.
FINDINGS: There are no findings suspicious for malignancy.
IMPRESSION: No mammographic evidence of malignancy. A result letter of this
screening mammogram will be mailed directly to the patient.

RECOMMENDATION:
Screening mammogram in one year. (Code:51-O-LD2)

BI-RADS CATEGORY  1: Negative.

## 2022-01-18 ENCOUNTER — Emergency Department
Admission: EM | Admit: 2022-01-18 | Discharge: 2022-01-18 | Disposition: A | Discharge status: Home / Self Care | Payer: BC Managed Care – PPO | Attending: Emergency Medicine | Admitting: Emergency Medicine

## 2022-01-18 ENCOUNTER — Other Ambulatory Visit: Payer: Self-pay

## 2022-01-18 ENCOUNTER — Encounter: Payer: Self-pay | Admitting: Intensive Care

## 2022-01-18 ENCOUNTER — Emergency Department: Payer: BC Managed Care – PPO

## 2022-01-18 DIAGNOSIS — R109 Unspecified abdominal pain: Secondary | ICD-10-CM | POA: Diagnosis present

## 2022-01-18 DIAGNOSIS — N83291 Other ovarian cyst, right side: Secondary | ICD-10-CM | POA: Diagnosis not present

## 2022-01-18 DIAGNOSIS — K529 Noninfective gastroenteritis and colitis, unspecified: Secondary | ICD-10-CM | POA: Diagnosis not present

## 2022-01-18 DIAGNOSIS — I4891 Unspecified atrial fibrillation: Secondary | ICD-10-CM | POA: Diagnosis not present

## 2022-01-18 DIAGNOSIS — E876 Hypokalemia: Secondary | ICD-10-CM | POA: Insufficient documentation

## 2022-01-18 DIAGNOSIS — R1031 Right lower quadrant pain: Secondary | ICD-10-CM

## 2022-01-18 DIAGNOSIS — N83201 Unspecified ovarian cyst, right side: Secondary | ICD-10-CM

## 2022-01-18 HISTORY — DX: Crohn's disease, unspecified, without complications: K50.90

## 2022-01-18 LAB — CBC
HCT: 38.7 % (ref 36.0–46.0)
Hemoglobin: 12.7 g/dL (ref 12.0–15.0)
MCH: 28.8 pg (ref 26.0–34.0)
MCHC: 32.8 g/dL (ref 30.0–36.0)
MCV: 87.8 fL (ref 80.0–100.0)
Platelets: 248 10*3/uL (ref 150–400)
RBC: 4.41 MIL/uL (ref 3.87–5.11)
RDW: 13 % (ref 11.5–15.5)
WBC: 11.7 10*3/uL — ABNORMAL HIGH (ref 4.0–10.5)
nRBC: 0 % (ref 0.0–0.2)

## 2022-01-18 LAB — BASIC METABOLIC PANEL
Anion gap: 9 (ref 5–15)
BUN: 11 mg/dL (ref 6–20)
CO2: 29 mmol/L (ref 22–32)
Calcium: 9.6 mg/dL (ref 8.9–10.3)
Chloride: 104 mmol/L (ref 98–111)
Creatinine, Ser: 0.73 mg/dL (ref 0.44–1.00)
GFR, Estimated: >60 mL/min (ref >60)
Glucose, Bld: 109 mg/dL — ABNORMAL HIGH (ref 70–99)
Potassium: 2.9 mmol/L — ABNORMAL LOW (ref 3.5–5.1)
Sodium: 142 mmol/L (ref 135–145)

## 2022-01-18 LAB — URINALYSIS, ROUTINE W REFLEX MICROSCOPIC
Bilirubin Urine: NEGATIVE
Glucose, UA: NEGATIVE mg/dL
Hgb urine dipstick: NEGATIVE
Ketones, ur: 20 mg/dL — AB
Nitrite: NEGATIVE
Protein, ur: NEGATIVE mg/dL
Specific Gravity, Urine: 1.011 (ref 1.005–1.030)
pH: 6 (ref 5.0–8.0)

## 2022-01-18 MED ORDER — ONDANSETRON HCL 4 MG/2ML IJ SOLN
4.0000 mg | Freq: Once | INTRAMUSCULAR | Status: AC
  Administered 2022-01-18: 4 mg via INTRAVENOUS
  Filled 2022-01-18: qty 2

## 2022-01-18 MED ORDER — KETOROLAC TROMETHAMINE 15 MG/ML IJ SOLN
15.0000 mg | Freq: Once | INTRAMUSCULAR | Status: AC
  Administered 2022-01-18: 15 mg via INTRAVENOUS
  Filled 2022-01-18: qty 1

## 2022-01-18 MED ORDER — IOHEXOL 300 MG/ML  SOLN
100.0000 mL | Freq: Once | INTRAMUSCULAR | Status: AC | PRN
  Administered 2022-01-18: 100 mL via INTRAVENOUS

## 2022-01-18 MED ORDER — SODIUM CHLORIDE 0.9 % IV BOLUS
1000.0000 mL | Freq: Once | INTRAVENOUS | Status: AC
  Administered 2022-01-18: 1000 mL via INTRAVENOUS

## 2022-01-18 MED ORDER — POTASSIUM CHLORIDE 20 MEQ PO PACK
40.0000 meq | PACK | Freq: Two times a day (BID) | ORAL | Status: DC
  Administered 2022-01-18: 40 meq via ORAL
  Filled 2022-01-18: qty 2

## 2022-01-18 MED ORDER — POTASSIUM CHLORIDE 10 MEQ/100ML IV SOLN
10.0000 meq | Freq: Once | INTRAVENOUS | Status: AC
  Administered 2022-01-18: 10 meq via INTRAVENOUS
  Filled 2022-01-18: qty 100

## 2022-01-18 NOTE — ED Triage Notes (Signed)
Patient c/o right flank pain since Saturday. Reports throbbing pain

## 2022-01-18 NOTE — ED Provider Notes (Signed)
[facility] Provider Note    Event Date/Time   First MD Initiated Contact with Patient 01/18/22 1232     (approximate)   History   Flank Pain   HPI  Nicole Hays is a 56 y.o. female with a past medical history of atrial fibrillation status post ablation no longer on anticoagulation, hypercholesterolemia, GERD who presents today for evaluation of right sided flank pain.  Patient reports that this has been ongoing since Saturday.  She reports that the pain has since radiated into her right lower abdomen.  She reports that the pain is constant.  She feels nauseated with it but has not had any vomiting.  She denies fevers or chills.  She has not had any urinary problems.  No history of kidney stones.  Patient Active Problem List   Diagnosis Date Noted   Constipation 12/08/2021   Dysphagia 12/08/2021   Meralgia paresthetica 12/08/2021   Other seasonal allergic rhinitis 12/08/2021   Personal history of colonic polyps 12/08/2021   Pure hypercholesterolemia 12/08/2021   Seasonal allergies    Migraines    IBS (irritable bowel syndrome)    GERD (gastroesophageal reflux disease)    Atrial fibrillation (HCC)    Asthma    Anxiety    High risk medication use 03/30/2017   Paroxysmal atrial fibrillation ([place]) 03/02/2017   Endometrial polyp 07/31/2015   Menorrhagia with irregular cycle 07/27/2015   Anemia due to chronic blood loss 07/27/2015   Shingles 1995          Physical Exam   Triage Vital Signs: ED Triage Vitals  Enc Vitals Group     BP 01/18/22 1137 (!) 147/96     Pulse Rate 01/18/22 1137 82     Resp 01/18/22 1137 16     Temp 01/18/22 1137 98.5 F (36.9 C)     Temp Source 01/18/22 1137 Oral     SpO2 01/18/22 1137 93 %     Weight 01/18/22 1138 175 lb (79.4 kg)     Height 01/18/22 1138 5\' 3"  (1.6 m)     Head Circumference --      Peak Flow --      Pain Score 01/18/22 1137 7     Pain Loc --      Pain Edu? --      Excl. in [place]? --      Most recent vital signs: Vitals:   01/18/22 1611 01/18/22 1634  BP: (!) 140/88 134/73  Pulse: 80 78  Resp: 16 17  Temp: 98 F (36.7 C)   SpO2: 94% 94%    Physical Exam Vitals and nursing note reviewed.  Constitutional:      General: Awake and alert. No acute distress.    Appearance: Normal appearance. The patient is normal weight.  HENT:     Head: Normocephalic and atraumatic.     Mouth: Mucous membranes are moist.  Eyes:     General: PERRL. Normal EOMs        Right eye: No discharge.        Left eye: No discharge.     Conjunctiva/sclera: Conjunctivae normal.  Cardiovascular:     Rate and Rhythm: Normal rate and regular rhythm.     Pulses: Normal pulses.     Heart sounds: Normal heart sounds Pulmonary:     Effort: Pulmonary effort is normal. No respiratory distress.     Breath sounds: Normal breath sounds.  Abdominal:     Abdomen is soft.  There is mild RLQ abdominal tenderness. No rebound or guarding. No distention. No CVAT Musculoskeletal:        General: No swelling. Normal range of motion.     Cervical back: Normal range of motion and neck supple.  Skin:    General: Skin is warm and dry.     Capillary Refill: Capillary refill takes less than 2 seconds.     Findings: No rash.  Neurological:     Mental Status: The patient is awake and alert.      ED Results / Procedures / Treatments   Labs (all labs ordered are listed, but only abnormal results are displayed) Labs Reviewed  URINALYSIS, ROUTINE W REFLEX MICROSCOPIC - Abnormal; Notable for the following components:      Result Value   Color, Urine YELLOW (*)    APPearance CLEAR (*)    Ketones, ur 20 (*)    Leukocytes,Ua TRACE (*)    Bacteria, UA RARE (*)    All other components within normal limits  BASIC METABOLIC PANEL - Abnormal; Notable for the following components:   Potassium 2.9 (*)    Glucose, Bld 109 (*)    All other components within normal limits  CBC - Abnormal; Notable for the following  components:   WBC 11.7 (*)    All other components within normal limits     EKG     RADIOLOGY I independently reviewed and interpreted imaging and agree with radiologists findings.     PROCEDURES:  Critical Care performed:   Procedures   MEDICATIONS ORDERED IN ED: Medications  potassium chloride (KLOR-CON) packet 40 mEq (40 mEq Oral Given 01/18/22 1257)  sodium chloride 0.9 % bolus 1,000 mL (0 mLs Intravenous Stopped 01/18/22 1634)  ondansetron (ZOFRAN) injection 4 mg (4 mg Intravenous Given 01/18/22 1257)  ketorolac (TORADOL) 15 MG/ML injection 15 mg (15 mg Intravenous Given 01/18/22 1257)  potassium chloride 10 mEq in 100 mL IVPB (0 mEq Intravenous Stopped 01/18/22 1442)  iohexol (OMNIPAQUE) 300 MG/ML solution 100 mL (100 mLs Intravenous Contrast Given 01/18/22 1330)     IMPRESSION / MDM / ASSESSMENT AND PLAN / ED COURSE  I reviewed the triage vital signs and the nursing notes.   Differential diagnosis includes, but is not limited to, nephrolithiasis gastroenteritis, UTI/pyelonephritis, appendicitis, ovarian etiology.  Patient is awake and alert, hemodynamically stable and afebrile.  Labs obtained are overall reassuring with the exception of hypokalemia, and her potassium was repleted with IV and p.o. potassium.  CAT scan was obtained and demonstrates diffuse colitis, some lymphadenopathy in her right lower quadrant, as well as a 2.5 cm right ovarian cyst.  She agreed to ultrasound for evaluation of ovarian torsion.  Ultrasound reveals benign-appearing ovarian cyst without ovarian torsion.  Possible etiologies include mesenteric adenitis or gastroenteritis.  I recommended bland food, hydration, and close outpatient follow-up to ensure that her labs and presentation/findings have resolved.  I also recommended that she get a colonoscopy with her outpatient provider.  In the meantime we discussed return precautions.  She requested a work note for tomorrow which was provided.  She was  discharged in stable condition in the care of her husband.   Patient's presentation is most consistent with acute complicated illness / injury requiring diagnostic workup.      FINAL CLINICAL IMPRESSION(S) / ED DIAGNOSES   Final diagnoses:  Noninfectious gastroenteritis, unspecified type  Hypokalemia  Cyst of right ovary     Rx / DC Orders   ED Discharge Orders  None        Note:  This document was prepared using Dragon voice recognition software and may include unintentional dictation errors.   Emeline Gins 01/18/22 1656    Harvest Dark, MD 01/21/22 2310

## 2022-01-18 NOTE — Discharge Instructions (Signed)
Your potassium was noted to be low, so this was repleted in the emergency department.  Your CAT scan shows some findings consistent with a possible gastroenteritis.  Please remember to stay hydrated and eat bland foods.  It also showed a cyst on your ovary, therefore an ultrasound was obtained which shows no twisting of your ovary.  Please return for any new, worsening, or change in symptoms or other concerns.  It was a pleasure caring for you today.

## 2022-01-19 ENCOUNTER — Encounter: Payer: Self-pay | Admitting: Nurse Practitioner

## 2022-01-19 NOTE — Telephone Encounter (Signed)
Are we able to schedule her for an ultrasound in 6 months (around mid March) or will we need to place a recall?

## 2022-01-20 NOTE — Telephone Encounter (Signed)
Great. Thank you.

## 2022-06-30 ENCOUNTER — Other Ambulatory Visit: Payer: Self-pay | Admitting: Nurse Practitioner

## 2022-06-30 DIAGNOSIS — N83201 Unspecified ovarian cyst, right side: Secondary | ICD-10-CM

## 2022-08-12 ENCOUNTER — Ambulatory Visit (INDEPENDENT_AMBULATORY_CARE_PROVIDER_SITE_OTHER): Payer: BC Managed Care – PPO | Admitting: Nurse Practitioner

## 2022-08-12 ENCOUNTER — Encounter: Payer: Self-pay | Admitting: Nurse Practitioner

## 2022-08-12 ENCOUNTER — Ambulatory Visit (INDEPENDENT_AMBULATORY_CARE_PROVIDER_SITE_OTHER): Payer: BC Managed Care – PPO

## 2022-08-12 VITALS — BP 122/78 | HR 63 | Resp 20 | Ht 61.81 in | Wt 169.4 lb

## 2022-08-12 DIAGNOSIS — N83201 Unspecified ovarian cyst, right side: Secondary | ICD-10-CM | POA: Diagnosis not present

## 2022-08-12 DIAGNOSIS — R1031 Right lower quadrant pain: Secondary | ICD-10-CM

## 2022-08-12 NOTE — Progress Notes (Signed)
   Acute Office Visit  Subjective:    Patient ID: Nicole Hays, female    DOB: [birth year], 57 y.o.   MRN: [id number]   HPI 57 y.o. [id number] presents today for ultrasound for follow up on right ovarian cyst. Seen 01/18/22 in ER with complaints of abdominal pain. Ultrasound showed 2.6 cm simple appearing cyst in right adnexa, another cyst was present but not measured. Also treated for colitis at that time. Was told after colonoscopy in 2021 that she has latent Crohn's. She has changed her diet and has some improvement in abdominal pain. Pain is intermittent, on right side, worse at the end of the day. Sees PCP this afternoon.    Patient's last menstrual period was 05/22/2015.    Review of Systems  Constitutional: Negative.   Gastrointestinal:  Positive for abdominal pain and constipation.  Genitourinary: Negative.        Objective:    Physical Exam Constitutional:      Appearance: Normal appearance.     BP 122/78 (BP Location: Right Arm, Patient Position: Sitting)   Pulse 63   Resp 20   Ht 5' 1.81" (1.57 m)   Wt 169 lb 6.4 oz (76.8 kg)   LMP 05/22/2015   SpO2 97%   BMI 31.17 kg/m  Wt Readings from Last 3 Encounters:  08/12/22 169 lb 6.4 oz (76.8 kg)  01/18/22 175 lb (79.4 kg)  12/08/21 175 lb 0.6 oz (79.4 kg)         Assessment & Plan:   Problem List Items Addressed This Visit   None Visit Diagnoses     Right ovarian cyst    -  Primary   Right lower quadrant abdominal pain          Vaginal ultrasound:  Anteverted uterus, normal size, no myometrial masses.  Thin, symmetrical endometrium.  No masses or thickening seen.  Right ovary with 2 simple avascular cysts - 2.6 cm (stable), 1.8 cm (seen on previous outside scan but not measured).  No adnexal masses, no free fluid.   Plan: Ultrasound thoroughly reviewed with patient. Reassure on benign nature of small simple ovarian cysts. Not likely cause for discomfort and likely GI-related. Sees PCP this afternoon  to discuss more.      Olivia Mackie DNP, 10:43 AM 08/12/2022

## 2022-08-16 ENCOUNTER — Ambulatory Visit: Payer: BC Managed Care – PPO | Admitting: Nurse Practitioner

## 2022-10-05 ENCOUNTER — Ambulatory Visit: Payer: BC Managed Care – PPO | Admitting: Nurse Practitioner

## 2022-10-25 ENCOUNTER — Other Ambulatory Visit: Payer: Self-pay

## 2022-10-25 DIAGNOSIS — Z8262 Family history of osteoporosis: Secondary | ICD-10-CM

## 2022-10-25 DIAGNOSIS — Z13820 Encounter for screening for osteoporosis: Secondary | ICD-10-CM

## 2022-11-02 ENCOUNTER — Other Ambulatory Visit: Payer: Self-pay | Admitting: Nurse Practitioner

## 2022-11-02 ENCOUNTER — Ambulatory Visit (INDEPENDENT_AMBULATORY_CARE_PROVIDER_SITE_OTHER): Payer: BC Managed Care – PPO

## 2022-11-02 DIAGNOSIS — Z8262 Family history of osteoporosis: Secondary | ICD-10-CM

## 2022-11-02 DIAGNOSIS — Z13820 Encounter for screening for osteoporosis: Secondary | ICD-10-CM

## 2022-11-02 DIAGNOSIS — Z780 Asymptomatic menopausal state: Secondary | ICD-10-CM

## 2022-11-17 NOTE — Progress Notes (Signed)
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   Last seen in EP Clinic 08/2021, doing well at that time.     Since last being seen in our clinic the patient reports doing very well from an AF standpoint. No known episodes, wears her Apple watch to monitor her EKG.  States she will occasionally have episodes of fluttering in her chest that last seconds and she will take a deep breath and it self resolves. Was involved in a study for cholesterol and was identified to have per-diabetes with an A1c of 5.9%.    She denies chest pain, palpitations, dyspnea, PND, orthopnea, nausea, vomiting, dizziness, syncope, edema, weight gain, or early satiety.   Review of systems complete and found to be negative unless listed in HPI.   EP Information / Studies Reviewed:    EKG is ordered today. Personal review as below.  EKG Interpretation Date/Time:  Thursday November 18 2022 09:06:04 EDT Ventricular Rate:  65 PR Interval:  184 QRS Duration:  82 QT Interval:  382 QTC Calculation: 397 R Axis:   -33  Text Interpretation: Normal sinus rhythm Left axis deviation When compared with ECG of 08-Jun-2019 08:33, PR interval has decreased Confirmed by Canary Brim ([id number]) on 11/18/2022 9:12:34 AM    Rhythm / AAD Hx  AF diagnosed 01/2017 PVI ablation 05/11/2019  Multaq initiated 2018 > stopped post ablation 08/2019   Studies:  ECHO 2018 > LVEF 55-65% CT Coronary Morphology 05/2019 > normal pulmonary vein drainage into LA, no thrombus in the LA appendage, no PFO/ASD, normal coronary origin / R dominance AF Ablation 05/2019 > PVI    Risk Assessment/Calculations:    CHA2DS2-VASc Score = 1   This  indicates a 0.6% annual risk of stroke. The patient's score is based upon: CHF History: 0 HTN History: 0 Diabetes History: 0 Stroke History: 0 Vascular Disease History: 0 Age Score: 0 Gender Score: 1              Physical Exam:   VS:  BP 136/84   Pulse 65   Ht 5\' 3"  (1.6 m)   Wt 170 lb 9.6 oz (77.4 kg)   LMP 05/22/2015   SpO2 95%   BMI 30.22 kg/m    Wt Readings from Last 3 Encounters:  11/18/22 170 lb 9.6 oz (77.4 kg)  08/12/22 169 lb 6.4 oz (76.8 kg)  01/18/22 175 lb (79.4 kg)     GEN: Well nourished, well developed in no acute distress NECK: No JVD; No carotid bruits CARDIAC: Regular rate and rhythm, no murmurs, rubs, gallops RESPIRATORY:  Clear to auscultation without rales, wheezing or rhonchi  ABDOMEN: Soft, non-tender, non-distended EXTREMITIES:  No edema; No deformity   ASSESSMENT AND PLAN:    Paroxysmal Atrial Fibrillation  S/P PVI Ablation 2021  -no episodes of recurrent AF  -pt monitoring via Apple watch  -no indication for anticoagulation at this time as post PVI ablation and CHA2DS2Vasc 1 (0.6% annual risk)  -discussed with patient if began to have episodes of AF at home and in/out of rhythm to notify EP Clinic      Follow up with EP APP in 12 months  Signed, Canary Brim, MSN,  APRN, NP-C, AGACNP-BC [facility] - Electrophysiology  11/18/2022, 9:37 AM

## 2022-11-18 ENCOUNTER — Encounter: Payer: Self-pay | Admitting: Pulmonary Disease

## 2022-11-18 ENCOUNTER — Ambulatory Visit: Payer: BC Managed Care – PPO | Attending: Student | Admitting: Pulmonary Disease

## 2022-11-18 VITALS — BP 136/84 | HR 65 | Ht 63.0 in | Wt 170.6 lb

## 2022-11-18 DIAGNOSIS — Z9889 Other specified postprocedural states: Secondary | ICD-10-CM | POA: Diagnosis not present

## 2022-11-18 DIAGNOSIS — I480 Paroxysmal atrial fibrillation: Secondary | ICD-10-CM | POA: Diagnosis not present

## 2022-11-18 NOTE — Patient Instructions (Addendum)
Medication Instructions:  Your physician recommends that you continue on your current medications as directed. Please refer to the Current Medication list given to you today.  *If you need a refill on your cardiac medications before your next appointment, please call your pharmacy*  Lab Work: None ordered If you have labs (blood work) drawn today and your tests are completely normal, you will receive your results only by: MyChart Message (if you have MyChart) OR A paper copy in the mail If you have any lab test that is abnormal or we need to change your treatment, we will call you to review the results.  Follow-Up: At [facility], you and your health needs are our priority.  As part of our continuing mission to provide you with exceptional heart care, we have created designated Provider Care Teams.  These Care Teams include your primary Cardiologist (physician) and Advanced Practice Providers (APPs -  Physician Assistants and Nurse Practitioners) who all work together to provide you with the care you need, when you need it.  Your next appointment:   1 year(s)  Provider:   You may see Will Jorja Loa, MD or one of the following Advanced Practice Providers on your designated Care Team:   Francis Dowse, [place] [address]" [place], [place] Sherie Don, NP Canary Brim, NP  Schedule follow up appointment with Dr Dulce Sellar

## 2022-12-06 ENCOUNTER — Ambulatory Visit: Payer: BC Managed Care – PPO | Admitting: Nurse Practitioner

## 2022-12-24 DIAGNOSIS — Z1231 Encounter for screening mammogram for malignant neoplasm of breast: Secondary | ICD-10-CM

## 2022-12-31 ENCOUNTER — Other Ambulatory Visit: Payer: Self-pay | Admitting: Nurse Practitioner

## 2022-12-31 DIAGNOSIS — Z1231 Encounter for screening mammogram for malignant neoplasm of breast: Secondary | ICD-10-CM

## 2023-01-02 ENCOUNTER — Other Ambulatory Visit: Payer: Self-pay | Admitting: Cardiology

## 2023-01-26 NOTE — Progress Notes (Unsigned)
Cardiology Office Note:    Date:  01/27/2023   ID:  Nicole Hays, DOB June 28, 1965, MRN [id number]  PCP:  Soundra Pilon, FNP  Cardiologist:  Norman Herrlich, MD    Referring MD: Soundra Pilon, FNP    ASSESSMENT:    1. Paroxysmal atrial fibrillation (HCC)   2. Status post catheter ablation of atrial fibrillation   3. Pure hypercholesterolemia   4. Prediabetes PLAN:    In order of problems listed above:  She continues to have a good long-term result from her atrial fibrillation ablation self-monitoring with an Apple Watch no recurrence and I cautioned her about over-the-counter proarrhythmic drugs Calcium score was 0 but she had severe dyslipidemia and elected to take a statin we will check a CMP and lipid profile today Encourage activity and weight loss for prediabetes goal 6500 steps a day   Next appointment: 1 year   Medication Adjustments/Labs and Tests Ordered: Current medicines are reviewed at length with the patient today.  Concerns regarding medicines are outlined above.  Orders Placed This Encounter  Procedures   EKG 12-Lead   No orders of the defined types were placed in this encounter.    History of Present Illness:    Nicole Hays is a 57 y.o. female with a hx of paroxysmal atrial fibrillation with EP catheter ablation maintaining sinus rhythm last seen 11/18/2022 EP clinic [facility] and by me 12/08/2021.  Complaints of sexual dysfunction beta-blocker was withdrawn.  Compliance with diet, lifestyle and medications: Yes  she has done quite well occasional palpitation not severe or sustained and she self monitors with the Apple Watch She had labs done in the summer LP(a) was 9.8 prediabetic with A1c of 5.9 did not have a lipid profile No edema shortness of breath chest pain syncope She takes a statin we will do a CMP and lipid profile today Past Medical History:  Diagnosis Date   Anxiety    Asthma    rarely uses inhaler   Atrial fibrillation  (HCC)    Crohn's disease (HCC)    GERD (gastroesophageal reflux disease)    IBS (irritable bowel syndrome)    Migraines    last one years ago   Seasonal allergies    Shingles 1995    Current Medications: Current Meds  Medication Sig   acetaminophen (TYLENOL) 500 MG tablet Take 500-1,000 mg by mouth every 6 (six) hours as needed (for pain.).    albuterol (PROVENTIL HFA;VENTOLIN HFA) 108 (90 Base) MCG/ACT inhaler Inhale into the lungs every 6 (six) hours as needed for wheezing or shortness of breath.   calcium carbonate (TUMS EX) 750 MG chewable tablet Chew 1 tablet by mouth daily as needed for heartburn.   clobetasol cream (TEMOVATE) 0.05 % Apply topically as needed (rash).   diphenoxylate-atropine (LOMOTIL) 2.5-0.025 MG per tablet Take 1 tablet by mouth 4 (four) times daily as needed for diarrhea or loose stools (IBS).    loratadine (CLARITIN) 10 MG tablet Take 10 mg by mouth daily.   Magnesium 250 MG TABS Take 250 mg by mouth every evening.   montelukast (SINGULAIR) 10 MG tablet Take 10 mg by mouth at bedtime.    Multiple Vitamins-Minerals (EMERGEN-C IMMUNE PLUS PO) Take 1 tablet by mouth daily as needed (To prevent cold.). Reported on 10/23/2015   neomycin-bacitracin-polymyxin (NEOSPORIN) ointment Apply 1 application topically 2 (two) times daily as needed for wound care.   nystatin-triamcinolone ointment (MYCOLOG) Apply 1 application topically 2 (two) times daily.   pravastatin (  PRAVACHOL) 20 MG tablet TAKE 1 TABLET (20 MG TOTAL) BY MOUTH 3 (THREE) TIMES A WEEK. TAKE ON MONDAY, WEDNESDAY AND FRIDAY   vitamin C (ASCORBIC ACID) 500 MG tablet Take 500 mg by mouth 2 (two) times daily.    Vitamin D3 (VITAMIN D) 25 MCG tablet Take 1,000 Units by mouth every evening.      EKGs/Labs/Other Studies Reviewed:    The following studies were reviewed today:    EKG Interpretation Date/Time:  Thursday January 27 2023 13:08:30 EDT Ventricular Rate:  80 PR Interval:  184 QRS  Duration:  80 QT Interval:  362 QTC Calculation: 417 R Axis:   -45  Text Interpretation: Normal sinus rhythm Low voltage QRS Left anterior fascicular block When compared with ECG of 18-Nov-2022 09:06, No significant change was found Confirmed by Norman Herrlich ([id number]) on 01/27/2023 1:20:43 PM   Recent Labs: No results found for requested labs within last 365 days.  Recent Lipid Panel    Component Value Date/Time   CHOL 254 (H) 12/03/2021 0913   TRIG 76 12/03/2021 0913   HDL 59 12/03/2021 0913   CHOLHDL 4.3 12/03/2021 0913   LDLCALC 182 (H) 12/03/2021 0913    Physical Exam:    VS:  BP 124/60 (BP Location: Right Arm, Patient Position: Sitting, Cuff Size: Normal)   Pulse 80   Ht 5\' 3"  (1.6 m)   Wt 173 lb 3.2 oz (78.6 kg)   LMP 05/22/2015   SpO2 95%   BMI 30.68 kg/m     Wt Readings from Last 3 Encounters:  01/27/23 173 lb 3.2 oz (78.6 kg)  11/18/22 170 lb 9.6 oz (77.4 kg)  08/12/22 169 lb 6.4 oz (76.8 kg)     GEN:  Well nourished, well developed in no acute distress HEENT: Normal NECK: No JVD; No carotid bruits LYMPHATICS: No lymphadenopathy CARDIAC: RRR, no murmurs, rubs, gallops RESPIRATORY:  Clear to auscultation without rales, wheezing or rhonchi  ABDOMEN: Soft, non-tender, non-distended MUSCULOSKELETAL:  No edema; No deformity  SKIN: Warm and dry NEUROLOGIC:  Alert and oriented x 3 PSYCHIATRIC:  Normal affect    Signed, Norman Herrlich, MD  01/27/2023 1:36 PM    [facility]

## 2023-01-27 ENCOUNTER — Other Ambulatory Visit: Payer: Self-pay | Admitting: Nurse Practitioner

## 2023-01-27 ENCOUNTER — Ambulatory Visit: Payer: BC Managed Care – PPO | Attending: Cardiology | Admitting: Cardiology

## 2023-01-27 ENCOUNTER — Encounter: Payer: Self-pay | Admitting: Cardiology

## 2023-01-27 ENCOUNTER — Ambulatory Visit
Admission: RE | Admit: 2023-01-27 | Discharge: 2023-01-27 | Disposition: A | Discharge status: Home / Self Care | Payer: BC Managed Care – PPO | Source: Ambulatory Visit

## 2023-01-27 VITALS — BP 124/60 | HR 80 | Ht 63.0 in | Wt 173.2 lb

## 2023-01-27 DIAGNOSIS — I480 Paroxysmal atrial fibrillation: Secondary | ICD-10-CM | POA: Diagnosis not present

## 2023-01-27 DIAGNOSIS — E7800 Pure hypercholesterolemia, unspecified: Secondary | ICD-10-CM

## 2023-01-27 DIAGNOSIS — Z9889 Other specified postprocedural states: Secondary | ICD-10-CM

## 2023-01-27 DIAGNOSIS — Z1231 Encounter for screening mammogram for malignant neoplasm of breast: Secondary | ICD-10-CM

## 2023-01-27 NOTE — Patient Instructions (Addendum)
Medication Instructions:  Your physician recommends that you continue on your current medications as directed. Please refer to the Current Medication list given to you today.  *If you need a refill on your cardiac medications before your next appointment, please call your pharmacy*   Lab Work: Your physician recommends that you return for lab work in:   Labs today: CMP, Lipids  If you have labs (blood work) drawn today and your tests are completely normal, you will receive your results only by: MyChart Message (if you have MyChart) OR A paper copy in the mail If you have any lab test that is abnormal or we need to change your treatment, we will call you to review the results.   Testing/Procedures: None   Follow-Up: At [facility], you and your health needs are our priority.  As part of our continuing mission to provide you with exceptional heart care, we have created designated Provider Care Teams.  These Care Teams include your primary Cardiologist (physician) and Advanced Practice Providers (APPs -  Physician Assistants and Nurse Practitioners) who all work together to provide you with the care you need, when you need it.  We recommend signing up for the patient portal called "MyChart".  Sign up information is provided on this After Visit Summary.  MyChart is used to connect with patients for Virtual Visits (Telemedicine).  Patients are able to view lab/test results, encounter notes, upcoming appointments, etc.  Non-urgent messages can be sent to your provider as well.   To learn more about what you can do with MyChart, go to [email or web address].    Your next appointment:   1 year(s)  Provider:   Norman Herrlich, MD    Other Instructions Goal of 6500 steps per day.  1. Avoid all over-the-counter antihistamines except Claritin/Loratadine and Zyrtec/Cetrizine. 2. Avoid all combination including cold sinus allergies flu decongestant and sleep medications 3. You can use  Robitussin DM Mucinex and Mucinex DM for cough. 4. can use Tylenol aspirin ibuprofen and naproxen but no combinations such as sleep or sinus.

## 2023-01-28 LAB — LIPID PANEL
Chol/HDL Ratio: 3.3 {ratio} (ref 0.0–4.4)
Cholesterol, Total: 217 mg/dL — ABNORMAL HIGH (ref 100–199)
HDL: 66 mg/dL (ref >39)
LDL Chol Calc (NIH): 137 mg/dL — ABNORMAL HIGH (ref 0–99)
Triglycerides: 78 mg/dL (ref 0–149)
VLDL Cholesterol Cal: 14 mg/dL (ref 5–40)

## 2023-01-28 LAB — COMPREHENSIVE METABOLIC PANEL
ALT: 11 [IU]/L (ref 0–32)
AST: 17 [IU]/L (ref 0–40)
Albumin: 3.9 g/dL (ref 3.8–4.9)
Alkaline Phosphatase: 64 [IU]/L (ref 44–121)
BUN/Creatinine Ratio: 19 (ref 9–23)
BUN: 12 mg/dL (ref 6–24)
Bilirubin Total: 0.2 mg/dL (ref 0.0–1.2)
CO2: 28 mmol/L (ref 20–29)
Calcium: 9.8 mg/dL (ref 8.7–10.2)
Chloride: 101 mmol/L (ref 96–106)
Creatinine, Ser: 0.63 mg/dL (ref 0.57–1.00)
Globulin, Total: 2.6 g/dL (ref 1.5–4.5)
Glucose: 97 mg/dL (ref 70–99)
Potassium: 3.2 mmol/L — ABNORMAL LOW (ref 3.5–5.2)
Sodium: 143 mmol/L (ref 134–144)
Total Protein: 6.5 g/dL (ref 6.0–8.5)
eGFR: 105 mL/min/{1.73_m2} (ref >59)

## 2023-02-07 ENCOUNTER — Telehealth: Payer: Self-pay

## 2023-02-07 DIAGNOSIS — E876 Hypokalemia: Secondary | ICD-10-CM

## 2023-02-07 MED ORDER — POTASSIUM CHLORIDE CRYS ER 20 MEQ PO TBCR: 20.0000 meq | EXTENDED_RELEASE_TABLET | Freq: Every day | ORAL | 3 refills | Status: DC

## 2023-02-07 NOTE — Telephone Encounter (Signed)
MyChart message

## 2023-02-07 NOTE — Telephone Encounter (Signed)
-----   Message from Nurse Ladonna Snide F sent at 01/28/2023  7:27 AM EDT -----  ----- Message ----- From: Baldo Daub, MD Sent: 01/28/2023   7:12 AM EDT To: Mickie Bail Ash/Hp Triage  Continue statin  Needs K 20 meq day  2 weeks recheck BMP mag and aldosterone/ renin level

## 2023-02-08 ENCOUNTER — Ambulatory Visit: Payer: BC Managed Care – PPO | Admitting: Nurse Practitioner

## 2023-03-13 LAB — BASIC METABOLIC PANEL
BUN/Creatinine Ratio: 12 (ref 9–23)
BUN: 7 mg/dL (ref 6–24)
CO2: 29 mmol/L (ref 20–29)
Calcium: 9.6 mg/dL (ref 8.7–10.2)
Chloride: 105 mmol/L (ref 96–106)
Creatinine, Ser: 0.58 mg/dL (ref 0.57–1.00)
Glucose: 88 mg/dL (ref 70–99)
Potassium: 3.9 mmol/L (ref 3.5–5.2)
Sodium: 145 mmol/L — ABNORMAL HIGH (ref 134–144)
eGFR: 107 mL/min/{1.73_m2} (ref >59)

## 2023-03-13 LAB — ALDOSTERONE + RENIN ACTIVITY W/ RATIO
Aldos/Renin Ratio: 76 — ABNORMAL HIGH (ref 0.0–30.0)
Aldosterone: 12.7 ng/dL (ref 0.0–30.0)
Renin Activity, Plasma: 0.167 ng/mL/h (ref 0.167–5.380)

## 2023-03-13 LAB — MAGNESIUM: Magnesium: 2.2 mg/dL (ref 1.6–2.3)

## 2023-03-14 ENCOUNTER — Telehealth: Payer: Self-pay | Admitting: Cardiology

## 2023-03-14 ENCOUNTER — Encounter: Payer: Self-pay | Admitting: Cardiology

## 2023-03-14 ENCOUNTER — Other Ambulatory Visit: Payer: Self-pay

## 2023-03-14 DIAGNOSIS — E876 Hypokalemia: Secondary | ICD-10-CM

## 2023-03-14 MED ORDER — SPIRONOLACTONE 25 MG PO TABS: 25.0000 mg | ORAL_TABLET | Freq: Every day | ORAL | 3 refills | Status: DC

## 2023-03-14 NOTE — Telephone Encounter (Signed)
° °  Pt is returning call to get lab result °

## 2023-03-14 NOTE — Telephone Encounter (Signed)
Patient informed of results.  

## 2023-04-07 LAB — BASIC METABOLIC PANEL
BUN/Creatinine Ratio: 14 (ref 9–23)
BUN: 8 mg/dL (ref 6–24)
CO2: 26 mmol/L (ref 20–29)
Calcium: 10.1 mg/dL (ref 8.7–10.2)
Chloride: 106 mmol/L (ref 96–106)
Creatinine, Ser: 0.58 mg/dL (ref 0.57–1.00)
Glucose: 82 mg/dL (ref 70–99)
Potassium: 4 mmol/L (ref 3.5–5.2)
Sodium: 146 mmol/L — ABNORMAL HIGH (ref 134–144)
eGFR: 107 mL/min/{1.73_m2} (ref >59)

## 2023-12-06 ENCOUNTER — Other Ambulatory Visit: Payer: Self-pay | Admitting: Cardiology

## 2024-01-20 ENCOUNTER — Ambulatory Visit: Admitting: Nurse Practitioner

## 2024-01-20 ENCOUNTER — Encounter: Payer: Self-pay | Admitting: Nurse Practitioner

## 2024-01-20 VITALS — BP 126/84 | HR 86 | Ht 62.5 in | Wt 174.0 lb

## 2024-01-20 DIAGNOSIS — Z1331 Encounter for screening for depression: Secondary | ICD-10-CM | POA: Diagnosis not present

## 2024-01-20 DIAGNOSIS — Z780 Asymptomatic menopausal state: Secondary | ICD-10-CM | POA: Diagnosis not present

## 2024-01-20 DIAGNOSIS — Z01419 Encounter for gynecological examination (general) (routine) without abnormal findings: Secondary | ICD-10-CM | POA: Diagnosis not present

## 2024-01-20 NOTE — Progress Notes (Signed)
   Nicole  P Hays [birth year] [id number]   History:  58 y.o. G2P2002 presents for annual exam. Postmenopausal - no HRT. Normal pap and mammogram history. HLD, a fib managed by cardiology.   Gynecologic History Patient's last menstrual period was 05/22/2015.   Contraception/Family planning: post menopausal status Sexually active: Yes  Health Maintenance Last Pap: 08/13/2021. Results were: Normal neg HPV Last mammogram: 01/27/2023. Results were: Normal Last colonoscopy: 12/2019. Results were: Latent crohn's, 5-year recall Last Dexa: 11/02/2022. Results were: Normal     01/20/2024    2:54 PM  Depression screen PHQ 2/9  Decreased Interest 0  Down, Depressed, Hopeless 0  PHQ - 2 Score 0     Past medical history, past surgical history, family history and social history were all reviewed and documented in the EPIC chart. Married. 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 Has daughter and son, both live local. Has hobby farm.  ROS:  A ROS was performed and pertinent positives and negatives are included.  Exam:  Vitals:   01/20/24 1452  BP: 126/84  Pulse: 86  SpO2: 94%  Weight: 174 lb (78.9 kg)  Height: 5' 2.5 (1.588 m)     Body mass index is 31.32 kg/m.  General appearance:  Normal Thyroid:  Symmetrical, normal in size, without palpable masses or nodularity. Respiratory  Auscultation:  Clear without wheezing or rhonchi Cardiovascular  Auscultation:  Regular rate, without rubs, murmurs or gallops  Edema/varicosities:  Not grossly evident Abdominal  Soft,nontender, without masses, guarding or rebound.  Liver/spleen:  No organomegaly noted  Hernia:  None appreciated  Skin  Inspection:  Grossly normal   Breasts: Examined lying and sitting.   Right: Without masses, retractions, discharge or axillary adenopathy.   Left: Without masses, retractions, discharge or axillary adenopathy. Pelvic: External genitalia:  no lesions              Urethra:  normal appearing urethra with no  masses, tenderness or lesions              Bartholins and Skenes: normal                 Vagina: normal appearing vagina with normal color and discharge, no lesions              Cervix: Two ~2 mm polyps Bimanual Exam:  Uterus:  no masses or tenderness              Adnexa: no mass, fullness, tenderness              Rectovaginal: Deferred              Anus:  normal, no lesions  Nicole Hays, CMA present as chaperone.   Assessment/Plan:  58 y.o. [id number] for annual exam.   Well female exam with routine gynecological exam - Education provided on SBEs, importance of preventative screenings, current guidelines, high calcium diet, regular exercise, and multivitamin daily.  Labs with PCP and cardiology.   Postmenopausal - No HRT.   Screening for cervical cancer - Normal Pap history. Will repeat at 5-year interval per guidelines.   Screening for breast cancer - Normal mammogram history.  Continue annual screenings.  Normal breast exam today.  Screening for colon cancer - 2021 colonoscopy. Will repeat at 5-year interval per GI's recommendation.  Return in about 1 year (around 01/19/2025) for Annual.     Nicole DELENA Shutter DNP, 3:09 PM 01/20/2024

## 2024-01-24 ENCOUNTER — Ambulatory Visit: Payer: Self-pay | Admitting: Cardiology

## 2024-01-25 ENCOUNTER — Other Ambulatory Visit: Payer: Self-pay | Admitting: Nurse Practitioner

## 2024-01-25 DIAGNOSIS — Z1231 Encounter for screening mammogram for malignant neoplasm of breast: Secondary | ICD-10-CM

## 2024-01-28 ENCOUNTER — Other Ambulatory Visit: Payer: Self-pay | Admitting: Cardiology

## 2024-01-28 DIAGNOSIS — E876 Hypokalemia: Secondary | ICD-10-CM

## 2024-02-09 ENCOUNTER — Ambulatory Visit: Admission: RE | Admit: 2024-02-09 | Discharge: 2024-02-09 | Disposition: A | Source: Ambulatory Visit

## 2024-02-09 DIAGNOSIS — Z1231 Encounter for screening mammogram for malignant neoplasm of breast: Secondary | ICD-10-CM

## 2024-02-28 ENCOUNTER — Other Ambulatory Visit: Payer: Self-pay | Admitting: Cardiology

## 2024-03-03 ENCOUNTER — Other Ambulatory Visit: Payer: Self-pay | Admitting: Cardiology

## 2024-04-19 DIAGNOSIS — K5090 Crohn's disease, unspecified, without complications: Secondary | ICD-10-CM | POA: Insufficient documentation

## 2024-04-19 DIAGNOSIS — E559 Vitamin D deficiency, unspecified: Secondary | ICD-10-CM | POA: Insufficient documentation

## 2024-04-19 DIAGNOSIS — E669 Obesity, unspecified: Secondary | ICD-10-CM | POA: Insufficient documentation

## 2024-04-22 NOTE — Progress Notes (Unsigned)
 " Cardiology Office Note:    Date:  04/23/2024   ID:  Nicole Hays  P Hays, Nicole Hays Hays [birth year], MRN [id number]  PCP:  Nicole Hays Prentice SAUNDERS, FNP  Cardiologist:  Redell Leiter, MD    Referring MD: Nicole Hays Prentice SAUNDERS, FNP    ASSESSMENT:    1. Paroxysmal atrial fibrillation (HCC)   2. Status post catheter ablation of atrial fibrillation   3. Pure hypercholesterolemia    PLAN:    In order of problems listed above:  Continues to do well after atrial fibrillation ablation no recurrence not on antiarrhythmic drug and will self monitor your smart watch At this time I do not think she requires anticoagulation Though calcium score was 0 she has elected to take a statin and recheck her statin profile lipids APO B today as well as a CMP include potassium   Next appointment: 1 year   Medication Adjustments/Labs and Tests Ordered: Current medicines are reviewed at length with the patient today.  Concerns regarding medicines are outlined above.  Orders Placed This Encounter  Procedures   CT ABDOMEN WO CONTRAST   Lipid Profile   Apolipoprotein B   Comp Met (CMET)   EKG 12-Lead   No orders of the defined types were placed in this encounter.    History of Present Illness:    Nicole  P Hays is a 58 y.o. female with a hx of paroxysmal atrial fibrillation maintaining sinus rhythm after catheter ablation hyperlipidemia severe on statin therapy despite calcium score of 0 prediabetes last seen 01/27/2023.  Compliance with diet, lifestyle and medications: Yes  She is very insightful shows me her labs from last year and ask if she has had her further evaluation for elevated aldosterone renin level I am going to have her do a noncontrast CT but my suspicion of adrenal adenoma is low She has gained about 10 pounds not as active as she was before still feels well has no indication of recurrent atrial fibrillation uses smart watch to self monitor no palpitations syncope edema shortness of breath  Labs in  August showed surprisingly elevated LDL 160 on pravastatin  A1c 5.9 hemoglobin 30.4 creatinine 0.58 potassium 4.4 Past Medical History:  Diagnosis Date   Anemia due to chronic blood loss 07/27/2015   Anxiety    Asthma    rarely uses inhaler   Atrial fibrillation (HCC)    Constipation 12/08/2021   Crohn's disease (HCC)    Dysphagia 12/08/2021   Endometrial polyp 07/31/2015   GERD (gastroesophageal reflux disease)    High risk medication use 03/30/2017   History of colonic polyps 12/08/2021   IMO SNOMED Dx Update Oct 2024     IBS (irritable bowel syndrome)    Menorrhagia with irregular cycle 07/27/2015   Meralgia paresthetica 12/08/2021   Migraines    last one years ago   Obese 04/19/2024   Other seasonal allergic rhinitis 12/08/2021   Paroxysmal atrial fibrillation (HCC) 03/02/2017   Pure hypercholesterolemia 12/08/2021   Seasonal allergies    Shingles 1995   Vitamin D deficiency 04/19/2024    Current Medications: Active Medications[1]    EKGs/Labs/Other Studies Reviewed:    The following studies were reviewed today:  EKG Interpretation Date/Time:  Monday April 23 2024 09:54:02 EST Ventricular Rate:  65 PR Interval:    QRS Duration:  80 QT Interval:  360 QTC Calculation: 374 R Axis:   -31  Text Interpretation: Sinus rhythm Artifact Left axis deviation Low voltage QRS When compared with ECG of 27-Jan-2023 13:08, No significant change  since last tracing Confirmed by Monetta Rogue ([id number]) on 04/23/2024 10:03:02 AM   Recent Labs: No results found for requested labs within last 365 days.  Recent Lipid Panel    Component Value Date/Time   CHOL 217 (H) 01/27/2023 1343   TRIG 78 01/27/2023 1343   HDL 66 01/27/2023 1343   CHOLHDL 3.3 01/27/2023 1343   LDLCALC 137 (H) 01/27/2023 1343    Physical Exam:    VS:  BP 122/88   Pulse 65   Ht 5' 2.5 (1.588 m)   Wt 179 lb (81.2 kg)   LMP 05/22/2015   SpO2 98%   BMI 32.22 kg/m     Wt Readings from Last 3  Encounters:  04/23/24 179 lb (81.2 kg)  01/20/24 174 lb (78.9 kg)  01/27/23 173 lb 3.2 oz (78.6 kg)     GEN:  Well nourished, well developed in no acute distress HEENT: Normal NECK: No JVD; No carotid bruits LYMPHATICS: No lymphadenopathy CARDIAC: RRR, no murmurs, rubs, gallops RESPIRATORY:  Clear to auscultation without rales, wheezing or rhonchi  ABDOMEN: Soft, non-tender, non-distended MUSCULOSKELETAL:  No edema; No deformity  SKIN: Warm and dry NEUROLOGIC:  Alert and oriented x 3 PSYCHIATRIC:  Normal affect    Signed, Rogue Monetta, MD  04/23/2024 10:38 AM    [facility]      [1]  Current Meds  Medication Sig   acetaminophen  (TYLENOL ) 500 MG tablet Take 500-1,000 mg by mouth every 6 (six) hours as needed (for pain.).    albuterol (PROVENTIL HFA;VENTOLIN HFA) 108 (90 Base) MCG/ACT inhaler Inhale into the lungs every 6 (six) hours as needed for wheezing or shortness of breath.   calcium carbonate (TUMS EX) 750 MG chewable tablet Chew 1 tablet by mouth daily as needed for heartburn.   clobetasol cream (TEMOVATE) 0.05 % Apply topically as needed (rash).   diphenoxylate-atropine (LOMOTIL) 2.5-0.025 MG per tablet Take 1 tablet by mouth 4 (four) times daily as needed for diarrhea or loose stools (IBS).    loratadine (CLARITIN) 10 MG tablet Take 10 mg by mouth daily.   Magnesium 250 MG TABS Take 250 mg by mouth every evening.   montelukast (SINGULAIR) 10 MG tablet Take 10 mg by mouth at bedtime.    Multiple Vitamins-Minerals (EMERGEN-C IMMUNE PLUS PO) Take 1 tablet by mouth daily as needed (To prevent cold.). Reported on 10/23/2015   neomycin-bacitracin-polymyxin (NEOSPORIN) ointment Apply 1 application topically 2 (two) times daily as needed for wound care.   nystatin -triamcinolone  ointment (MYCOLOG) Apply 1 application topically 2 (two) times daily.   potassium chloride  SA (KLOR-CON  M) 20 MEQ tablet TAKE 1 TABLET BY MOUTH EVERY DAY   pravastatin   (PRAVACHOL ) 20 MG tablet TAKE 1 TABLET (20 MG TOTAL) BY MOUTH 3 (THREE) TIMES A WEEK. TAKE ON MONDAY, WEDNESDAY AND FRIDAY   spironolactone  (ALDACTONE ) 25 MG tablet TAKE 1 TABLET (25 MG TOTAL) BY MOUTH DAILY.   vitamin C (ASCORBIC ACID) 500 MG tablet Take 500 mg by mouth 2 (two) times daily.    Vitamin D3 (VITAMIN D) 25 MCG tablet Take 1,000 Units by mouth every evening.   "

## 2024-04-23 ENCOUNTER — Ambulatory Visit: Attending: Cardiology | Admitting: Cardiology

## 2024-04-23 ENCOUNTER — Encounter: Payer: Self-pay | Admitting: Cardiology

## 2024-04-23 VITALS — BP 122/88 | HR 65 | Ht 62.5 in | Wt 179.0 lb

## 2024-04-23 DIAGNOSIS — E7800 Pure hypercholesterolemia, unspecified: Secondary | ICD-10-CM

## 2024-04-23 DIAGNOSIS — Z9889 Other specified postprocedural states: Secondary | ICD-10-CM | POA: Diagnosis not present

## 2024-04-23 DIAGNOSIS — I480 Paroxysmal atrial fibrillation: Secondary | ICD-10-CM

## 2024-04-23 DIAGNOSIS — D3500 Benign neoplasm of unspecified adrenal gland: Secondary | ICD-10-CM

## 2024-04-23 NOTE — Patient Instructions (Signed)
 Medication Instructions:  Your physician recommends that you continue on your current medications as directed. Please refer to the Current Medication list given to you today.  *If you need a refill on your cardiac medications before your next appointment, please call your pharmacy*  Lab Work: Your physician recommends that you return for lab work in:   Labs today: Lipids, Apo B, CMP  If you have labs (blood work) drawn today and your tests are completely normal, you will receive your results only by: MyChart Message (if you have MyChart) OR A paper copy in the mail If you have any lab test that is abnormal or we need to change your treatment, we will call you to review the results.  Testing/Procedures: Non-Cardiac CT scanning, (CAT scanning), is a noninvasive, special x-ray that produces cross-sectional images of the body using x-rays and a computer. CT scans help physicians diagnose and treat medical conditions. For some CT exams, a contrast material is used to enhance visibility in the area of the body being studied. CT scans provide greater clarity and reveal more details than regular x-ray exams.   Follow-Up: At [facility], you and your health needs are our priority.  As part of our continuing mission to provide you with exceptional heart care, our providers are all part of one team.  This team includes your primary Cardiologist (physician) and Advanced Practice Providers or APPs (Physician Assistants and Nurse Practitioners) who all work together to provide you with the care you need, when you need it.  Your next appointment:   1 year(s)  Provider:   Redell Leiter, MD    We recommend signing up for the patient portal called MyChart.  Sign up information is provided on this After Visit Summary.  MyChart is used to connect with patients for Virtual Visits (Telemedicine).  Patients are able to view lab/test results, encounter notes, upcoming appointments, etc.  Non-urgent  messages can be sent to your provider as well.   To learn more about what you can do with MyChart, go to [email or web address].   Other Instructions None

## 2024-04-24 ENCOUNTER — Ambulatory Visit: Payer: Self-pay | Admitting: Cardiology

## 2024-04-24 ENCOUNTER — Other Ambulatory Visit: Payer: Self-pay

## 2024-04-24 DIAGNOSIS — E782 Mixed hyperlipidemia: Secondary | ICD-10-CM

## 2024-04-24 LAB — COMPREHENSIVE METABOLIC PANEL WITH GFR
ALT: 13 IU/L (ref 0–32)
AST: 18 IU/L (ref 0–40)
Albumin: 4.1 g/dL (ref 3.8–4.9)
Alkaline Phosphatase: 64 IU/L (ref 49–135)
BUN/Creatinine Ratio: 16 (ref 9–23)
BUN: 11 mg/dL (ref 6–24)
Bilirubin Total: 0.3 mg/dL (ref 0.0–1.2)
CO2: 24 mmol/L (ref 20–29)
Calcium: 10.1 mg/dL (ref 8.7–10.2)
Chloride: 104 mmol/L (ref 96–106)
Creatinine, Ser: 0.7 mg/dL (ref 0.57–1.00)
Globulin, Total: 2.4 g/dL (ref 1.5–4.5)
Glucose: 83 mg/dL (ref 70–99)
Potassium: 4.4 mmol/L (ref 3.5–5.2)
Sodium: 142 mmol/L (ref 134–144)
Total Protein: 6.5 g/dL (ref 6.0–8.5)
eGFR: 101 mL/min/1.73

## 2024-04-24 LAB — LIPID PANEL
Chol/HDL Ratio: 3.9 ratio (ref 0.0–4.4)
Cholesterol, Total: 240 mg/dL — ABNORMAL HIGH (ref 100–199)
HDL: 61 mg/dL
LDL Chol Calc (NIH): 162 mg/dL — ABNORMAL HIGH (ref 0–99)
Triglycerides: 96 mg/dL (ref 0–149)
VLDL Cholesterol Cal: 17 mg/dL (ref 5–40)

## 2024-04-24 LAB — APOLIPOPROTEIN B: Apolipoprotein B: 109 mg/dL — ABNORMAL HIGH

## 2024-04-24 MED ORDER — EZETIMIBE 10 MG PO TABS: 10.0000 mg | ORAL_TABLET | Freq: Every day | ORAL | 3 refills | Status: AC

## 2024-04-26 ENCOUNTER — Other Ambulatory Visit: Payer: Self-pay | Admitting: Cardiology

## 2024-04-26 DIAGNOSIS — E876 Hypokalemia: Secondary | ICD-10-CM

## 2024-05-07 NOTE — Addendum Note (Signed)
 Addended by: SHERRE ADE I on: 05/07/2024 03:58 PM   Modules accepted: Orders

## 2024-05-31 ENCOUNTER — Other Ambulatory Visit: Payer: Self-pay | Admitting: Cardiology

## 2024-06-15 ENCOUNTER — Other Ambulatory Visit (HOSPITAL_BASED_OUTPATIENT_CLINIC_OR_DEPARTMENT_OTHER): Admitting: Radiology
